# Patient Record
Sex: Female | Born: 1995
Health system: Southern US, Community
[De-identification: ages and names within clinical notes are randomized; demographics above are authoritative.]

## PROBLEM LIST (undated history)

## (undated) HISTORY — PX: WISDOM TOOTH EXTRACTION: SHX21

---
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
## 2014-01-11 IMAGING — CR RIGHT ELBOW - COMPLETE 3+ VIEW
1 series · 4 of 4 positions shown · non-contrast
Comparison: none

REASON FOR EXAM: injured elbow
COMMENTS:

PROCEDURE:     MDR - MDR ELBOW RT COMP W/ OBLIQUES  - May 12, 2012 [DATE]
RESULT:     Comparison: None.

[Series 1: lat · 0.17mm/px · 4 of 4 slices shown]
[im 1/4]
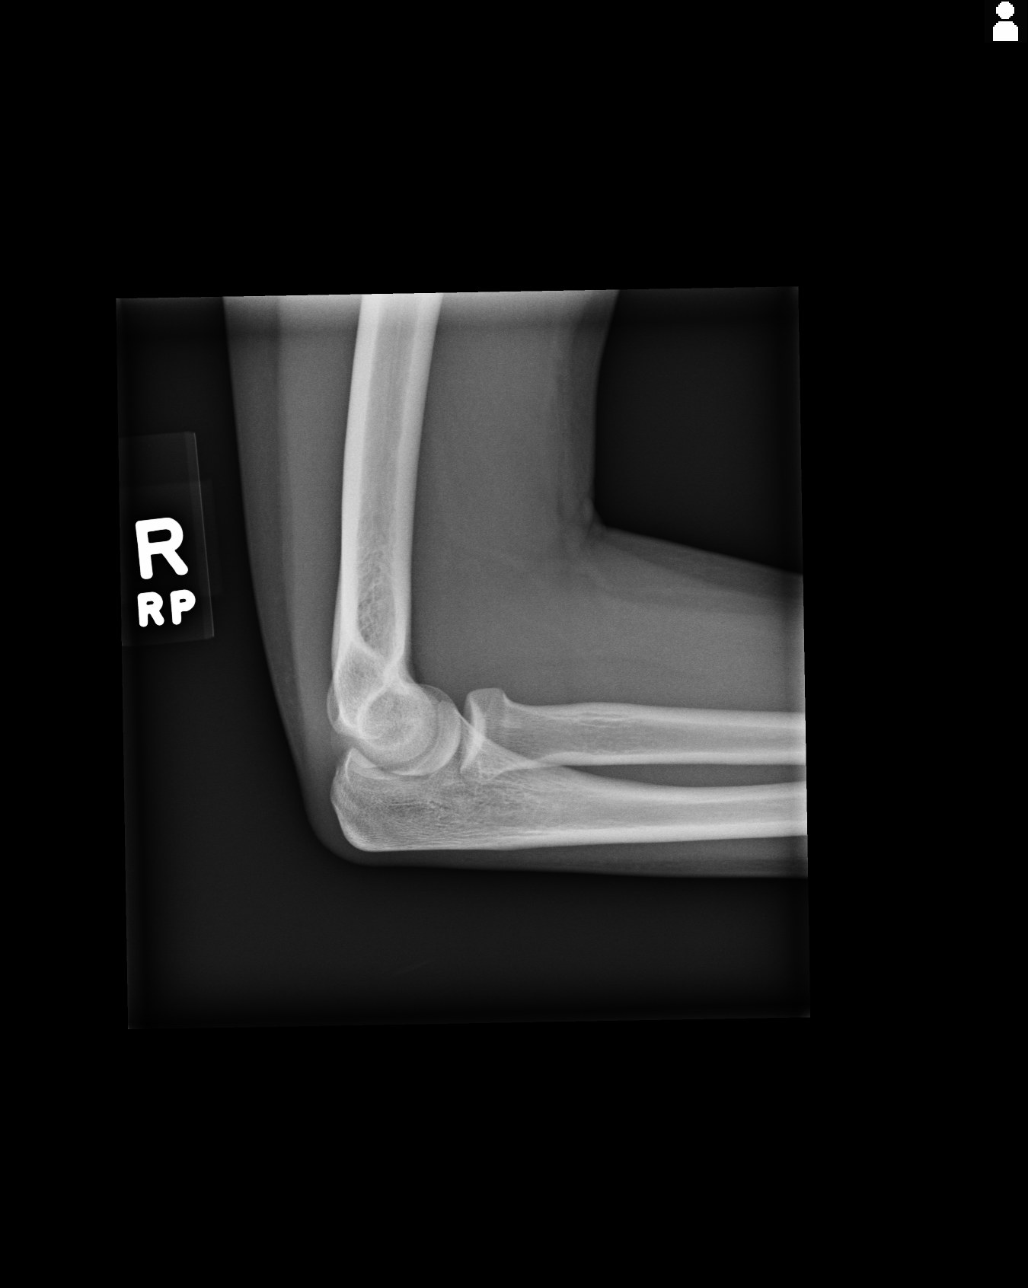
[im 2/4]
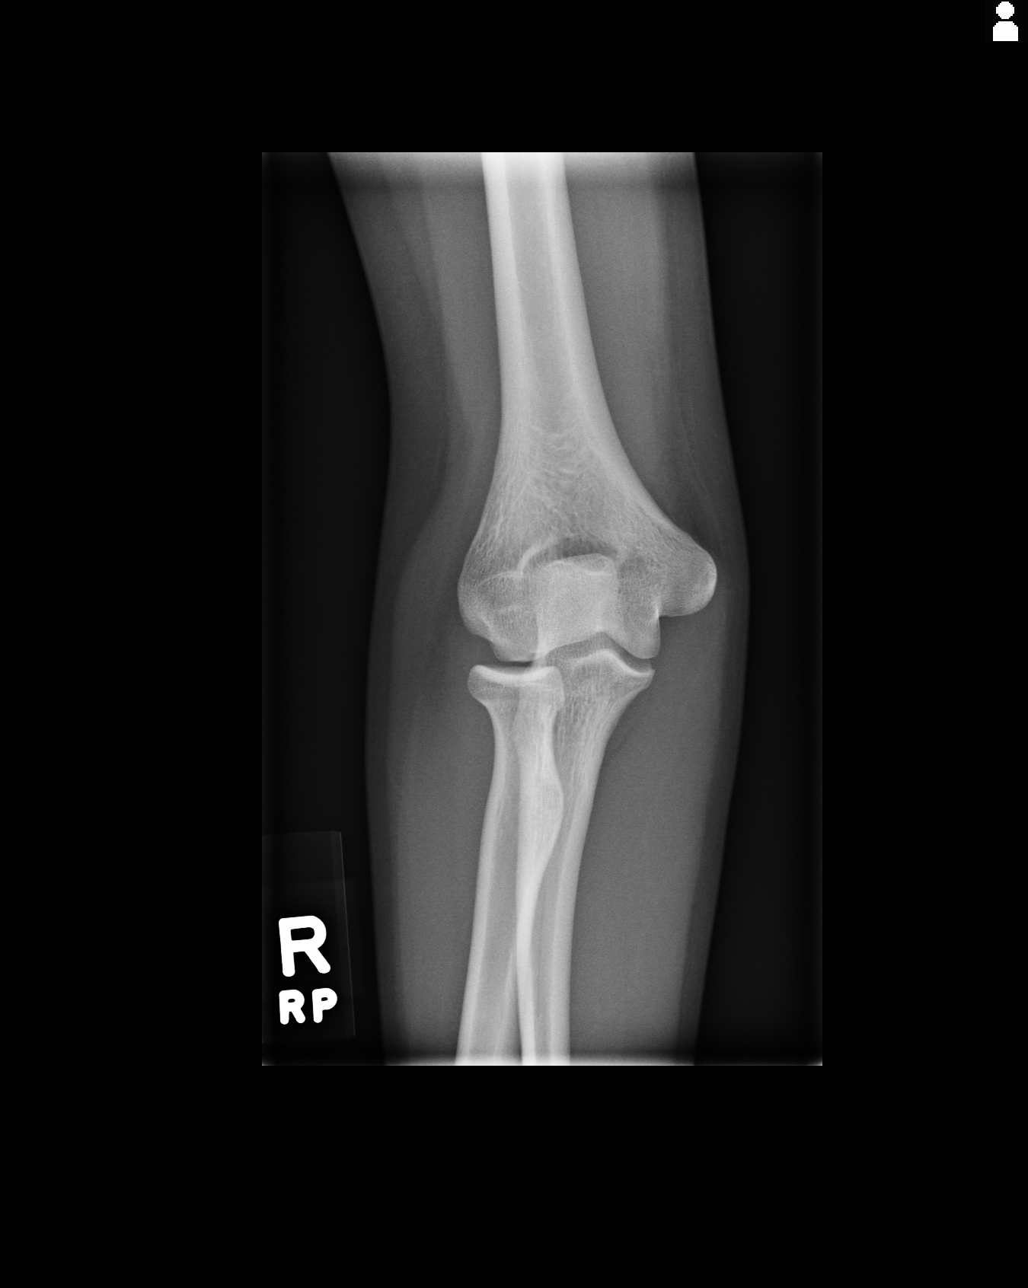
[im 3/4]
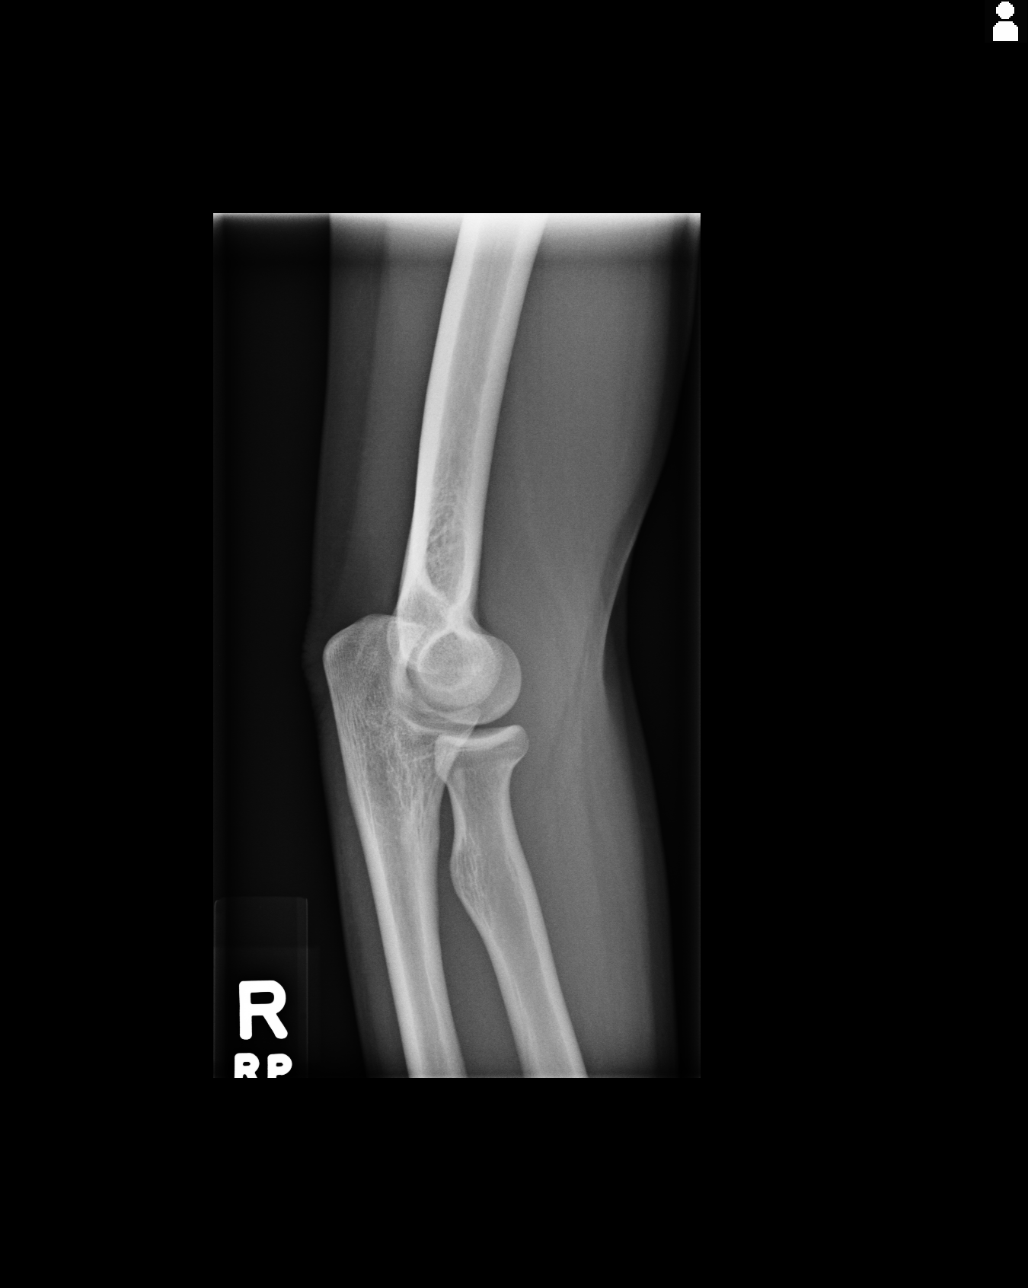
[im 4/4]
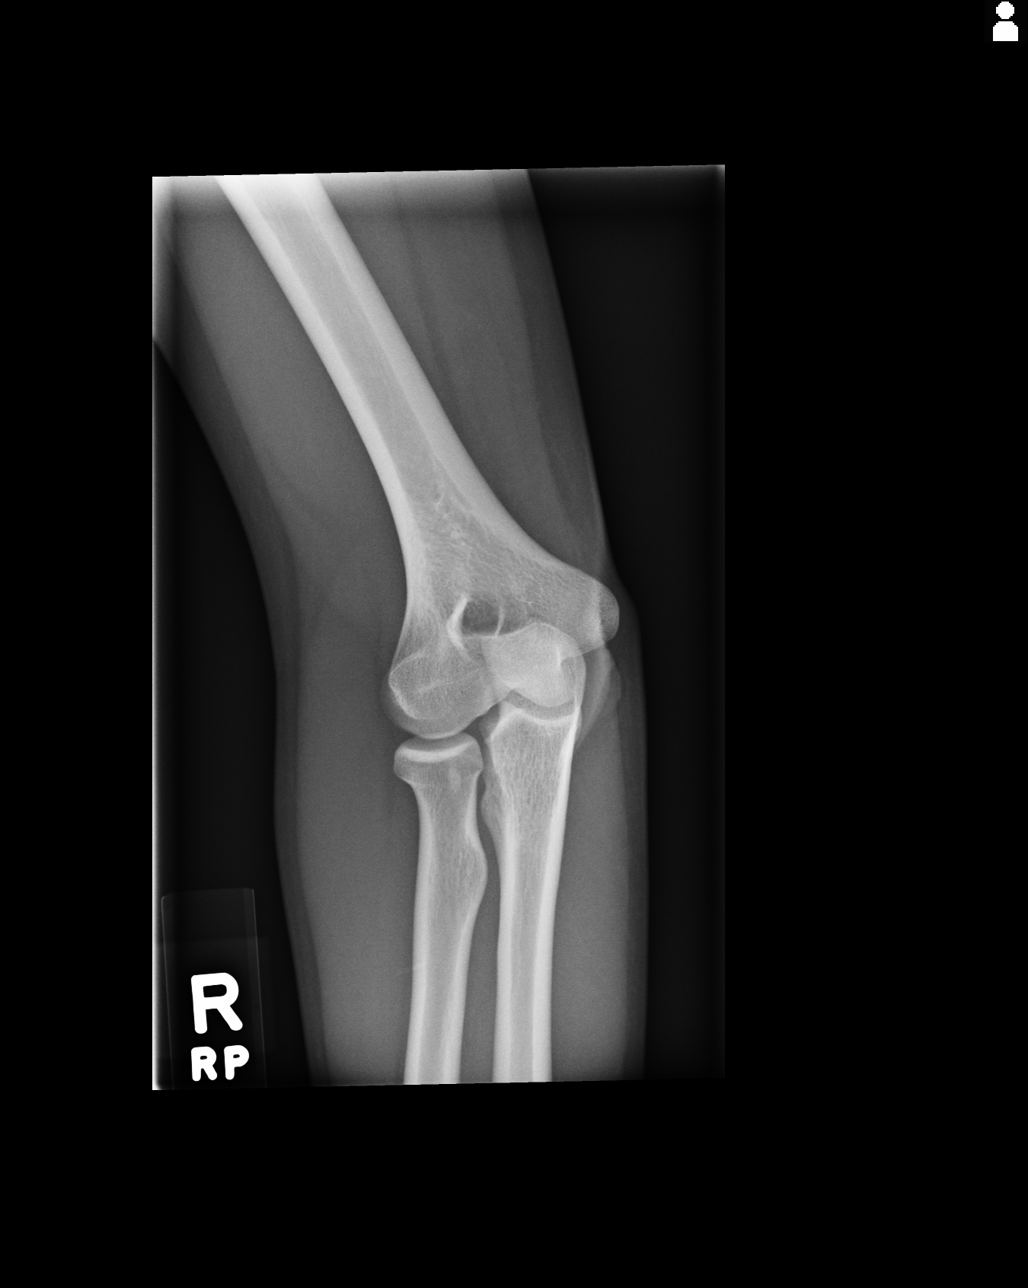

[4 of 4 positions shown; findings below may reference images not displayed]

FINDINGS: There is normal alignment. No elbow effusion. No acute fracture seen.
IMPRESSION: No fracture seen. If there is continued clinical concern, followup
radiographs could be performed in 7-10 days.

[REDACTED]

## 2014-12-24 ENCOUNTER — Encounter: Payer: Self-pay | Admitting: Unknown Physician Specialty

## 2014-12-26 ENCOUNTER — Ambulatory Visit (INDEPENDENT_AMBULATORY_CARE_PROVIDER_SITE_OTHER): Payer: 59 | Admitting: Unknown Physician Specialty

## 2014-12-26 ENCOUNTER — Encounter: Payer: Self-pay | Admitting: Unknown Physician Specialty

## 2014-12-26 VITALS — BP 106/75 | HR 64 | Temp 98.2°F | Ht 65.0 in | Wt 134.4 lb

## 2014-12-26 DIAGNOSIS — Z0000 Encounter for general adult medical examination without abnormal findings: Secondary | ICD-10-CM

## 2014-12-26 LAB — CBC
HEMATOCRIT: 39.9 % (ref 34.0–46.6)
Hemoglobin: 13.9 g/dL (ref 11.1–15.9)
MCH: 32.9 pg (ref 26.6–33.0)
MCHC: 34.8 g/dL (ref 31.5–35.7)
MCV: 94 fL (ref 79–97)
Platelets: 264 10*3/uL (ref 150–379)
RBC: 4.23 x10E6/uL (ref 3.77–5.28)
RDW: 12.7 % (ref 12.3–15.4)
WBC: 5.9 10*3/uL (ref 3.4–10.8)

## 2014-12-26 NOTE — Progress Notes (Signed)
   BP 106/75 mmHg  Pulse 64  Temp(Src) 98.2 F (36.8 C)  Ht 5\' 5"  (1.651 m)  Wt 134 lb 6.4 oz (60.963 kg)  BMI 22.37 kg/m2  SpO2 98%  LMP 12/12/2014 (Exact Date)   Subjective:    Patient ID: Zoe Ramsey, female    DOB: 1995-09-30, 19 y.o.   MRN: [id number]  HPI: Zoe Ramsey is a 19 y.o. female  Chief Complaint  Patient presents with  . Establish Care    Relevant past medical, surgical, family and social history reviewed and updated as indicated. Interim medical history since our last visit reviewed. Allergies and medications reviewed and updated.  Review of Systems  Constitutional: Negative.   HENT: Negative.   Eyes: Negative.   Respiratory: Negative.   Cardiovascular: Negative.   Gastrointestinal: Negative.   Endocrine: Negative.   Genitourinary: Negative.   Musculoskeletal: Negative.   Skin: Negative.   Allergic/Immunologic: Negative.   Neurological: Negative.   Hematological: Negative.   Psychiatric/Behavioral: Negative.     Per HPI unless specifically indicated above     Objective:    BP 106/75 mmHg  Pulse 64  Temp(Src) 98.2 F (36.8 C)  Ht 5\' 5"  (1.651 m)  Wt 134 lb 6.4 oz (60.963 kg)  BMI 22.37 kg/m2  SpO2 98%  LMP 12/12/2014 (Exact Date)  Wt Readings from Last 3 Encounters:  12/26/14 134 lb 6.4 oz (60.963 kg)    Physical Exam  Constitutional: She is oriented to person, place, and time. She appears well-developed and well-nourished.  HENT:  Head: Normocephalic and atraumatic.  Eyes: Pupils are equal, round, and reactive to light. Left eye exhibits no discharge.  Neck: Normal range of motion. Neck supple. Carotid bruit is not present. No thyromegaly present.  Cardiovascular: Normal rate, regular rhythm and normal heart sounds.  Exam reveals no gallop and no friction rub.   No murmur heard. Pulmonary/Chest: Effort normal and breath sounds normal. No respiratory distress. She has no wheezes. She has no rales.  Abdominal: Soft. Bowel  sounds are normal. There is no tenderness. There is no rebound.  Musculoskeletal: Normal range of motion.  Lymphadenopathy:    She has no cervical adenopathy.  Neurological: She is alert and oriented to person, place, and time.  Skin: Skin is warm, dry and intact. No rash noted.  Psychiatric: She has a normal mood and affect. Her speech is normal and behavior is normal. Judgment and thought content normal. Cognition and memory are normal.    No results found for this or any previous visit.    Assessment & Plan:   Problem List Items Addressed This Visit    None    Visit Diagnoses    Annual physical exam    -  Primary    Relevant Orders    CBC    Lipid Panel w/o Chol/HDL Ratio        Follow up plan: Return if symptoms worsen or fail to improve.

## 2014-12-27 ENCOUNTER — Encounter: Payer: Self-pay | Admitting: Unknown Physician Specialty

## 2014-12-27 LAB — LIPID PANEL W/O CHOL/HDL RATIO
Cholesterol, Total: 168 mg/dL (ref 100–199)
HDL: 53 mg/dL (ref >39)
LDL Calculated: 98 mg/dL (ref 0–99)
Triglycerides: 83 mg/dL (ref 0–149)
VLDL Cholesterol Cal: 17 mg/dL (ref 5–40)

## 2015-12-30 ENCOUNTER — Ambulatory Visit (INDEPENDENT_AMBULATORY_CARE_PROVIDER_SITE_OTHER): Payer: 59 | Admitting: Unknown Physician Specialty

## 2015-12-30 ENCOUNTER — Encounter: Payer: Self-pay | Admitting: Unknown Physician Specialty

## 2015-12-30 VITALS — BP 100/67 | HR 65 | Temp 97.6°F | Ht 64.6 in | Wt 136.0 lb

## 2015-12-30 DIAGNOSIS — Z0000 Encounter for general adult medical examination without abnormal findings: Secondary | ICD-10-CM | POA: Diagnosis not present

## 2015-12-30 DIAGNOSIS — Z23 Encounter for immunization: Secondary | ICD-10-CM | POA: Diagnosis not present

## 2015-12-30 NOTE — Progress Notes (Signed)
BP 100/67 (BP Location: Left Arm, Patient Position: Sitting, Cuff Size: Normal)   Pulse 65   Temp 97.6 F (36.4 C)   Ht 5' 4.6" (1.641 m)   Wt 136 lb (61.7 kg)   LMP 12/29/2015 (Exact Date)   SpO2 97%   BMI 22.91 kg/m    Subjective:    Patient ID: Zoe Ramsey, female    DOB: 1996-04-08, 20 y.o.   MRN: [id number]  HPI: Zoe Ramsey is a 20 y.o. female  Chief Complaint  Patient presents with  . Annual Exam    pt is due for pap but is currently on menstrual cycle   Pt is on her period.  She is not sexually active nor has ever been and not comfortable with a pelvic at this time.    States her menses are monthly and last about 5 days.    She feels she had had her Ghardisil vaccine as she remembers the 3 shot series.    Relevant past medical, surgical, family and social history reviewed and updated as indicated. Interim medical history since our last visit reviewed. Allergies and medications reviewed and updated.  Review of Systems  Per HPI unless specifically indicated above     Objective:    BP 100/67 (BP Location: Left Arm, Patient Position: Sitting, Cuff Size: Normal)   Pulse 65   Temp 97.6 F (36.4 C)   Ht 5' 4.6" (1.641 m)   Wt 136 lb (61.7 kg)   LMP 12/29/2015 (Exact Date)   SpO2 97%   BMI 22.91 kg/m   Wt Readings from Last 3 Encounters:  12/30/15 136 lb (61.7 kg)  12/26/14 134 lb 6.4 oz (61 kg)    Physical Exam  Constitutional: She is oriented to person, place, and time. She appears well-developed and well-nourished.  HENT:  Head: Normocephalic and atraumatic.  Eyes: Pupils are equal, round, and reactive to light. Right eye exhibits no discharge. Left eye exhibits no discharge. No scleral icterus.  Neck: Normal range of motion. Neck supple. Carotid bruit is not present. No thyromegaly present.  Cardiovascular: Normal rate, regular rhythm and normal heart sounds.  Exam reveals no gallop and no friction rub.   No murmur  heard. Pulmonary/Chest: Effort normal and breath sounds normal. No respiratory distress. She has no wheezes. She has no rales.  Abdominal: Soft. Bowel sounds are normal. There is no tenderness. There is no rebound.  Genitourinary: No breast swelling, tenderness or discharge.  Musculoskeletal: Normal range of motion.  Lymphadenopathy:    She has no cervical adenopathy.  Neurological: She is alert and oriented to person, place, and time.  Skin: Skin is warm, dry and intact. No rash noted.  Psychiatric: She has a normal mood and affect. Her speech is normal and behavior is normal. Judgment and thought content normal. Cognition and memory are normal.    Results for orders placed or performed in visit on 12/26/14  CBC  Result Value Ref Range   WBC 5.9 3.4 - 10.8 x10E3/uL   RBC 4.23 3.77 - 5.28 x10E6/uL   Hemoglobin 13.9 11.1 - 15.9 g/dL   Hematocrit 40.9 81.1 - 46.6 %   MCV 94 79 - 97 fL   MCH 32.9 26.6 - 33.0 pg   MCHC 34.8 31.5 - 35.7 g/dL   RDW 91.4 78.2 - 95.6 %   Platelets 264 150 - 379 x10E3/uL  Lipid Panel w/o Chol/HDL Ratio  Result Value Ref Range   Cholesterol, Total 168 100 -  199 mg/dL   Triglycerides 83 0 - 149 mg/dL   HDL 53 >65 mg/dL   VLDL Cholesterol Cal 17 5 - 40 mg/dL   LDL Calculated 98 0 - 99 mg/dL      Assessment & Plan:   Problem List Items Addressed This Visit    None    Visit Diagnoses    Annual physical exam    -  Primary       Follow up plan: Return in about 1 year (around 12/29/2016).

## 2016-12-30 ENCOUNTER — Encounter: Payer: 59 | Admitting: Unknown Physician Specialty

## 2017-01-06 ENCOUNTER — Encounter: Payer: Self-pay | Admitting: Unknown Physician Specialty

## 2017-01-06 ENCOUNTER — Ambulatory Visit (INDEPENDENT_AMBULATORY_CARE_PROVIDER_SITE_OTHER): Payer: 59 | Admitting: Unknown Physician Specialty

## 2017-01-06 VITALS — BP 119/82 | HR 75 | Temp 97.9°F | Ht 64.7 in | Wt 130.2 lb

## 2017-01-06 DIAGNOSIS — Z0000 Encounter for general adult medical examination without abnormal findings: Secondary | ICD-10-CM

## 2017-01-06 NOTE — Patient Instructions (Addendum)
Health Maintenance, Female Adopting a healthy lifestyle and getting preventive care can go a long way to promote health and wellness. Talk with your health care provider about what schedule of regular examinations is right for you. This is a good chance for you to check in with your provider about disease prevention and staying healthy. In between checkups, there are plenty of things you can do on your own. Experts have done a lot of research about which lifestyle changes and preventive measures are most likely to keep you healthy. Ask your health care provider for more information. Weight and diet Eat a healthy diet  Be sure to include plenty of vegetables, fruits, low-fat dairy products, and lean protein.  Do not eat a lot of foods high in solid fats, added sugars, or salt.  Get regular exercise. This is one of the most important things you can do for your health. ? Most adults should exercise for at least 150 minutes each week. The exercise should increase your heart rate and make you sweat (moderate-intensity exercise). ? Most adults should also do strengthening exercises at least twice a week. This is in addition to the moderate-intensity exercise.  Maintain a healthy weight  Body mass index (BMI) is a measurement that can be used to identify possible weight problems. It estimates body fat based on height and weight. Your health care provider can help determine your BMI and help you achieve or maintain a healthy weight.  For females 20 years of age and older: ? A BMI below 18.5 is considered underweight. ? A BMI of 18.5 to 24.9 is normal. ? A BMI of 25 to 29.9 is considered overweight. ? A BMI of 30 and above is considered obese.  Watch levels of cholesterol and blood lipids  You should start having your blood tested for lipids and cholesterol at 22 years of age, then have this test every 5 years.  You may need to have your cholesterol levels checked more often if: ? Your lipid or  cholesterol levels are high. ? You are older than 22 years of age. ? You are at high risk for heart disease.  Cancer screening Lung Cancer  Lung cancer screening is recommended for adults 55-80 years old who are at high risk for lung cancer because of a history of smoking.  A yearly low-dose CT scan of the lungs is recommended for people who: ? Currently smoke. ? Have quit within the past 15 years. ? Have at least a 30-pack-year history of smoking. A pack year is smoking an average of one pack of cigarettes a day for 1 year.  Yearly screening should continue until it has been 15 years since you quit.  Yearly screening should stop if you develop a health problem that would prevent you from having lung cancer treatment.  Breast Cancer  Practice breast self-awareness. This means understanding how your breasts normally appear and feel.  It also means doing regular breast self-exams. Let your health care provider know about any changes, no matter how small.  If you are in your 20s or 30s, you should have a clinical breast exam (CBE) by a health care provider every 1-3 years as part of a regular health exam.  If you are 40 or older, have a CBE every year. Also consider having a breast X-ray (mammogram) every year.  If you have a family history of breast cancer, talk to your health care provider about genetic screening.  If you are at high risk   for breast cancer, talk to your health care provider about having an MRI and a mammogram every year.  Breast cancer gene (BRCA) assessment is recommended for women who have family members with BRCA-related cancers. BRCA-related cancers include: ? Breast. ? Ovarian. ? Tubal. ? Peritoneal cancers.  Results of the assessment will determine the need for genetic counseling and BRCA1 and BRCA2 testing.  Cervical Cancer Your health care provider may recommend that you be screened regularly for cancer of the pelvic organs (ovaries, uterus, and  vagina). This screening involves a pelvic examination, including checking for microscopic changes to the surface of your cervix (Pap test). You may be encouraged to have this screening done every 3 years, beginning at age 22.  For women ages 56-65, health care providers may recommend pelvic exams and Pap testing every 3 years, or they may recommend the Pap and pelvic exam, combined with testing for human papilloma virus (HPV), every 5 years. Some types of HPV increase your risk of cervical cancer. Testing for HPV may also be done on women of any age with unclear Pap test results.  Other health care providers may not recommend any screening for nonpregnant women who are considered low risk for pelvic cancer and who do not have symptoms. Ask your health care provider if a screening pelvic exam is right for you.  If you have had past treatment for cervical cancer or a condition that could lead to cancer, you need Pap tests and screening for cancer for at least 20 years after your treatment. If Pap tests have been discontinued, your risk factors (such as having a new sexual partner) need to be reassessed to determine if screening should resume. Some women have medical problems that increase the chance of getting cervical cancer. In these cases, your health care provider may recommend more frequent screening and Pap tests.  Colorectal Cancer  This type of cancer can be detected and often prevented.  Routine colorectal cancer screening usually begins at 22 years of age and continues through 22 years of age.  Your health care provider may recommend screening at an earlier age if you have risk factors for colon cancer.  Your health care provider may also recommend using home test kits to check for hidden blood in the stool.  A small camera at the end of a tube can be used to examine your colon directly (sigmoidoscopy or colonoscopy). This is done to check for the earliest forms of colorectal  cancer.  Routine screening usually begins at age 33.  Direct examination of the colon should be repeated every 5-10 years through 22 years of age. However, you may need to be screened more often if early forms of precancerous polyps or small growths are found.  Skin Cancer  Check your skin from head to toe regularly.  Tell your health care provider about any new moles or changes in moles, especially if there is a change in a mole's shape or color.  Also tell your health care provider if you have a mole that is larger than the size of a pencil eraser.  Always use sunscreen. Apply sunscreen liberally and repeatedly throughout the day.  Protect yourself by wearing long sleeves, pants, a wide-brimmed hat, and sunglasses whenever you are outside.  Heart disease, diabetes, and high blood pressure  High blood pressure causes heart disease and increases the risk of stroke. High blood pressure is more likely to develop in: ? People who have blood pressure in the high end of  the normal range (130-139/85-89 mm Hg). ? People who are overweight or obese. ? People who are African American.  If you are 21-29 years of age, have your blood pressure checked every 3-5 years. If you are 3 years of age or older, have your blood pressure checked every year. You should have your blood pressure measured twice-once when you are at a hospital or clinic, and once when you are not at a hospital or clinic. Record the average of the two measurements. To check your blood pressure when you are not at a hospital or clinic, you can use: ? An automated blood pressure machine at a pharmacy. ? A home blood pressure monitor.  If you are between 17 years and 37 years old, ask your health care provider if you should take aspirin to prevent strokes.  Have regular diabetes screenings. This involves taking a blood sample to check your fasting blood sugar level. ? If you are at a normal weight and have a low risk for diabetes,  have this test once every three years after 22 years of age. ? If you are overweight and have a high risk for diabetes, consider being tested at a younger age or more often. Preventing infection Hepatitis B  If you have a higher risk for hepatitis B, you should be screened for this virus. You are considered at high risk for hepatitis B if: ? You were born in a country where hepatitis B is common. Ask your health care provider which countries are considered high risk. ? Your parents were born in a high-risk country, and you have not been immunized against hepatitis B (hepatitis B vaccine). ? You have HIV or AIDS. ? You use needles to inject street drugs. ? You live with someone who has hepatitis B. ? You have had sex with someone who has hepatitis B. ? You get hemodialysis treatment. ? You take certain medicines for conditions, including cancer, organ transplantation, and autoimmune conditions.  Hepatitis C  Blood testing is recommended for: ? Everyone born from [birth year] through 1966. ? Anyone with known risk factors for hepatitis C.  Sexually transmitted infections (STIs)  You should be screened for sexually transmitted infections (STIs) including gonorrhea and chlamydia if: ? You are sexually active and are younger than 22 years of age. ? You are older than 22 years of age and your health care provider tells you that you are at risk for this type of infection. ? Your sexual activity has changed since you were last screened and you are at an increased risk for chlamydia or gonorrhea. Ask your health care provider if you are at risk.  If you do not have HIV, but are at risk, it may be recommended that you take a prescription medicine daily to prevent HIV infection. This is called pre-exposure prophylaxis (PrEP). You are considered at risk if: ? You are sexually active and do not regularly use condoms or know the HIV status of your partner(s). ? You take drugs by injection. ? You are  sexually active with a partner who has HIV.  Talk with your health care provider about whether you are at high risk of being infected with HIV. If you choose to begin PrEP, you should first be tested for HIV. You should then be tested every 3 months for as long as you are taking PrEP. Pregnancy  If you are premenopausal and you may become pregnant, ask your health care provider about preconception counseling.  If you may become  pregnant, take 400 to 800 micrograms (mcg) of folic acid every day.  If you want to prevent pregnancy, talk to your health care provider about birth control (contraception). Osteoporosis and menopause  Osteoporosis is a disease in which the bones lose minerals and strength with aging. This can result in serious bone fractures. Your risk for osteoporosis can be identified using a bone density scan.  If you are 65 years of age or older, or if you are at risk for osteoporosis and fractures, ask your health care provider if you should be screened.  Ask your health care provider whether you should take a calcium or vitamin D supplement to lower your risk for osteoporosis.  Menopause may have certain physical symptoms and risks.  Hormone replacement therapy may reduce some of these symptoms and risks. Talk to your health care provider about whether hormone replacement therapy is right for you. Follow these instructions at home:  Schedule regular health, dental, and eye exams.  Stay current with your immunizations.  Do not use any tobacco products including cigarettes, chewing tobacco, or electronic cigarettes.  If you are pregnant, do not drink alcohol.  If you are breastfeeding, limit how much and how often you drink alcohol.  Limit alcohol intake to no more than 1 drink per day for nonpregnant women. One drink equals 12 ounces of beer, 5 ounces of wine, or 1 ounces of hard liquor.  Do not use street drugs.  Do not share needles.  Ask your health care  provider for help if you need support or information about quitting drugs.  Tell your health care provider if you often feel depressed.  Tell your health care provider if you have ever been abused or do not feel safe at home. This information is not intended to replace advice given to you by your health care provider. Make sure you discuss any questions you have with your health care provider. Document Released: 12/08/2010 Document Revised: 10/31/2015 Document Reviewed: 02/26/2015 Elsevier Interactive Patient Education  2018 [employer]  Preventive Care 18-39 Years, Female Preventive care refers to lifestyle choices and visits with your health care provider that can promote health and wellness. What does preventive care include?  A yearly physical exam. This is also called an annual well check.  Dental exams once or twice a year.  Routine eye exams. Ask your health care provider how often you should have your eyes checked.  Personal lifestyle choices, including: ? Daily care of your teeth and gums. ? Regular physical activity. ? Eating a healthy diet. ? Avoiding tobacco and drug use. ? Limiting alcohol use. ? Practicing safe sex. ? Taking vitamin and mineral supplements as recommended by your health care provider. What happens during an annual well check? The services and screenings done by your health care provider during your annual well check will depend on your age, overall health, lifestyle risk factors, and family history of disease. Counseling Your health care provider may ask you questions about your:  Alcohol use.  Tobacco use.  Drug use.  Emotional well-being.  Home and relationship well-being.  Sexual activity.  Eating habits.  Work and work environment.  Method of birth control.  Menstrual cycle.  Pregnancy history.  Screening You may have the following tests or measurements:  Height, weight, and BMI.  Diabetes screening. This is done by  checking your blood sugar (glucose) after you have not eaten for a while (fasting).  Blood pressure.  Lipid and cholesterol levels. These may be checked   every 5 years starting at age 20.  Skin check.  Hepatitis C blood test.  Hepatitis B blood test.  Sexually transmitted disease (STD) testing.  BRCA-related cancer screening. This may be done if you have a family history of breast, ovarian, tubal, or peritoneal cancers.  Pelvic exam and Pap test. This may be done every 3 years starting at age 21. Starting at age 30, this may be done every 5 years if you have a Pap test in combination with an HPV test.  Discuss your test results, treatment options, and if necessary, the need for more tests with your health care provider. Vaccines Your health care provider may recommend certain vaccines, such as:  Influenza vaccine. This is recommended every year.  Tetanus, diphtheria, and acellular pertussis (Tdap, Td) vaccine. You may need a Td booster every 10 years.  Varicella vaccine. You may need this if you have not been vaccinated.  HPV vaccine. If you are 26 or younger, you may need three doses over 6 months.  Measles, mumps, and rubella (MMR) vaccine. You may need at least one dose of MMR. You may also need a second dose.  Pneumococcal 13-valent conjugate (PCV13) vaccine. You may need this if you have certain conditions and were not previously vaccinated.  Pneumococcal polysaccharide (PPSV23) vaccine. You may need one or two doses if you smoke cigarettes or if you have certain conditions.  Meningococcal vaccine. One dose is recommended if you are age 19-21 years and a first-year college student living in a residence hall, or if you have one of several medical conditions. You may also need additional booster doses.  Hepatitis A vaccine. You may need this if you have certain conditions or if you travel or work in places where you may be exposed to hepatitis A.  Hepatitis B vaccine. You  may need this if you have certain conditions or if you travel or work in places where you may be exposed to hepatitis B.  Haemophilus influenzae type b (Hib) vaccine. You may need this if you have certain risk factors.  Talk to your health care provider about which screenings and vaccines you need and how often you need them. This information is not intended to replace advice given to you by your health care provider. Make sure you discuss any questions you have with your health care provider. Document Released: 07/21/2001 Document Revised: 02/12/2016 Document Reviewed: 03/26/2015 Elsevier Interactive Patient Education  2017 [employer] Inc.  

## 2017-01-06 NOTE — Progress Notes (Signed)
BP 119/82   Pulse 75   Temp 97.9 F (36.6 C)   Ht 5' 4.7" (1.643 m)   Wt 130 lb 3.2 oz (59.1 kg)   LMP 12/24/2016 (Exact Date)   SpO2 98%   BMI 21.87 kg/m    Subjective:    Patient ID: Zoe Ramsey, female    DOB: 1996/02/23, 21 y.o.   MRN: [id number]  HPI: Zoe Ramsey is a 21 y.o. female  Chief Complaint  Patient presents with  . 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    Not interested in Birth control at this time.  [id number]  Fever blisters on lips and interested in a Valtrex rx to take as needed.    Relevant past medical, surgical, family and social history reviewed and updated as indicated. Interim medical history since our last visit reviewed. Allergies and medications reviewed and updated.  Review of Systems  Constitutional: Negative.   HENT: Negative.   Eyes: Negative.   Respiratory: Negative.   Cardiovascular: Negative.   Gastrointestinal: Negative.   Endocrine: Negative.   Genitourinary: Negative.   Musculoskeletal: Negative.   Skin: Negative.   Allergic/Immunologic: Negative.   Neurological: Negative.   Hematological: Negative.   Psychiatric/Behavioral: Negative.     Per HPI unless specifically indicated above     Objective:    BP 119/82   Pulse 75   Temp 97.9 F (36.6 C)   Ht 5' 4.7" (1.643 m)   Wt 130 lb 3.2 oz (59.1 kg)   LMP 12/24/2016 (Exact Date)   SpO2 98%   BMI 21.87 kg/m   Wt Readings from Last 3 Encounters:  01/06/17 130 lb 3.2 oz (59.1 kg)  12/30/15 136 lb (61.7 kg)  12/26/14 134 lb 6.4 oz (61 kg)    Physical Exam  Constitutional: She is oriented to person, place, and time. She appears well-developed and well-nourished. No distress.  HENT:  Head: Normocephalic and atraumatic.  Eyes: Pupils are equal, round, and reactive to light. Conjunctivae and lids are normal. Right eye exhibits no discharge.  Left eye exhibits no discharge. No scleral icterus.  Neck: Normal range of motion. Neck supple. No JVD present. Carotid bruit is not present. No thyromegaly present.  Cardiovascular: Normal rate, regular rhythm and normal heart sounds.  Exam reveals no gallop and no friction rub.   No murmur heard. Pulmonary/Chest: Effort normal and breath sounds normal. No respiratory distress. She has no wheezes. She has no rales.  Abdominal: Soft. Normal appearance and bowel sounds are normal. There is no splenomegaly or hepatomegaly. There is no tenderness. There is no rebound.  Genitourinary: Vagina normal and uterus normal. No breast swelling, tenderness or discharge. Cervix exhibits no motion tenderness, no discharge and no friability. Right adnexum displays no mass, no tenderness and no fullness. Left adnexum displays no mass, no tenderness and no fullness.  Musculoskeletal: Normal range of motion.  Lymphadenopathy:    She has no cervical adenopathy.  Neurological: She is alert and oriented to person, place, and time.  Skin: Skin is warm, dry and intact. No rash noted. No pallor.  Psychiatric: She has a normal mood and affect. Her speech is normal and behavior is normal. Judgment and thought content normal. Cognition and memory are normal.    Results for orders placed or performed in visit on 12/26/14  CBC  Result  Value Ref Range   WBC 5.9 3.4 - 10.8 x10E3/uL   RBC 4.23 3.77 - 5.28 x10E6/uL   Hemoglobin 13.9 11.1 - 15.9 g/dL   Hematocrit 16.139.9 09.634.0 - 46.6 %   MCV 94 79 - 97 fL   MCH 32.9 26.6 - 33.0 pg   MCHC 34.8 31.5 - 35.7 g/dL   RDW 04.512.7 40.912.3 - 81.115.4 %   Platelets 264 150 - 379 x10E3/uL  Lipid Panel w/o Chol/HDL Ratio  Result Value Ref Range   Cholesterol, Total 168 100 - 199 mg/dL   Triglycerides 83 0 - 149 mg/dL   HDL 53 >91>39 mg/dL   VLDL Cholesterol Cal 17 5 - 40 mg/dL   LDL Calculated 98 0 - 99 mg/dL      Assessment & Plan:   Problem List Items Addressed This Visit    None      Visit Diagnoses    Annual physical exam    -  Primary   Relevant Orders   Pap Lb, rfx HPV ASCU   CBC with Differential/Platelet   Quantiferon tb gold assay (blood)       Follow up plan: Return in about 1 year (around 01/06/2018).

## 2017-01-07 LAB — CBC WITH DIFFERENTIAL/PLATELET
BASOS ABS: 0 10*3/uL (ref 0.0–0.2)
Basos: 0 %
EOS (ABSOLUTE): 0.1 10*3/uL (ref 0.0–0.4)
Eos: 2 %
Hematocrit: 39.3 % (ref 34.0–46.6)
Hemoglobin: 12.9 g/dL (ref 11.1–15.9)
IMMATURE GRANULOCYTES: 0 %
Immature Grans (Abs): 0 10*3/uL (ref 0.0–0.1)
Lymphocytes Absolute: 1.5 10*3/uL (ref 0.7–3.1)
Lymphs: 26 %
MCH: 30.7 pg (ref 26.6–33.0)
MCHC: 32.8 g/dL (ref 31.5–35.7)
MCV: 94 fL (ref 79–97)
Monocytes Absolute: 0.6 10*3/uL (ref 0.1–0.9)
Monocytes: 10 %
Neutrophils Absolute: 3.7 10*3/uL (ref 1.4–7.0)
Neutrophils: 62 %
Platelets: 313 10*3/uL (ref 150–379)
RBC: 4.2 x10E6/uL (ref 3.77–5.28)
RDW: 13.4 % (ref 12.3–15.4)
WBC: 5.9 10*3/uL (ref 3.4–10.8)

## 2017-01-08 LAB — PAP LB, RFX HPV ASCU: PAP Smear Comment: 0

## 2017-01-10 LAB — QUANTIFERON IN TUBE
QFT TB AG MINUS NIL VALUE: 0 IU/mL
QUANTIFERON MITOGEN VALUE: >10 IU/mL
QUANTIFERON TB AG VALUE: 0.09 IU/mL
QUANTIFERON TB GOLD: NEGATIVE
Quantiferon Nil Value: 0.09 IU/mL

## 2017-01-10 LAB — QUANTIFERON TB GOLD ASSAY (BLOOD)

## 2017-12-21 ENCOUNTER — Encounter: Payer: Self-pay | Admitting: Unknown Physician Specialty

## 2018-01-12 ENCOUNTER — Encounter: Payer: 59 | Admitting: Unknown Physician Specialty

## 2018-01-12 ENCOUNTER — Encounter: Payer: 59 | Admitting: Physician Assistant

## 2018-01-19 ENCOUNTER — Encounter: Payer: 59 | Admitting: Physician Assistant

## 2018-02-10 ENCOUNTER — Ambulatory Visit (INDEPENDENT_AMBULATORY_CARE_PROVIDER_SITE_OTHER): Payer: 59 | Admitting: Physician Assistant

## 2018-02-10 ENCOUNTER — Encounter: Payer: Self-pay | Admitting: Physician Assistant

## 2018-02-10 VITALS — BP 115/77 | HR 64 | Temp 98.3°F | Ht 64.8 in | Wt 130.2 lb

## 2018-02-10 DIAGNOSIS — Z130 Encounter for screening for diseases of the blood and blood-forming organs and certain disorders involving the immune mechanism: Secondary | ICD-10-CM | POA: Diagnosis not present

## 2018-02-10 DIAGNOSIS — Z131 Encounter for screening for diabetes mellitus: Secondary | ICD-10-CM | POA: Diagnosis not present

## 2018-02-10 DIAGNOSIS — Z0000 Encounter for general adult medical examination without abnormal findings: Secondary | ICD-10-CM | POA: Diagnosis not present

## 2018-02-10 DIAGNOSIS — Z1329 Encounter for screening for other suspected endocrine disorder: Secondary | ICD-10-CM | POA: Diagnosis not present

## 2018-02-10 DIAGNOSIS — Z13220 Encounter for screening for lipoid disorders: Secondary | ICD-10-CM

## 2018-02-10 NOTE — Progress Notes (Signed)
Subjective:    Patient ID: Zoe Ramsey, female    DOB: 1996-03-12, 22 y.o.   MRN: [id number]  Zoe Ramsey is a 22 y.o. female presenting on 02/10/2018 for Annual Exam   HPI  Living in [place] with parents, works at [employer]. No children, in a relationship, sexually active. Does not desire birth control. Denies flu shot.   PAP: 2018 normal  Tetanus: 2017 updated.    History reviewed. No pertinent past medical history. Past Surgical History:  Procedure Laterality Date  . WISDOM TOOTH EXTRACTION     Social History   Socioeconomic History  . Marital status: Single    Spouse name: Not on file  . Number of children: Not on file  . Years of education: Not on file  . Highest education level: Not on file  Occupational History  . Not on file  Social Needs  . Financial resource strain: Not on file  . Food insecurity:    Worry: Not on file    Inability: Not on file  . Transportation needs:    Medical: Not on file    Non-medical: Not on file  Tobacco Use  . Smoking status: Never Smoker  . Smokeless tobacco: Never Used  Substance and Sexual Activity  . Alcohol use: No    Alcohol/week: 0.0 standard drinks  . Drug use: No  . Sexual activity: Never  Lifestyle  . Physical activity:    Days per week: Not on file    Minutes per session: Not on file  . Stress: Not on file  Relationships  . Social connections:    Talks on phone: Not on file    Gets together: Not on file    Attends religious service: Not on file    Active member of club or organization: Not on file    Attends meetings of clubs or organizations: Not on file    Relationship status: Not on file  . Intimate partner violence:    Fear of current or ex partner: Not on file    Emotionally abused: Not on file    Physically abused: Not on file    Forced sexual activity: Not on file  Other Topics Concern  . Not on file  Social History Narrative  . Not on file   Family History  Problem Relation  Age of Onset  . Hypertension Father   . Heart disease Maternal Grandmother        CHF  . Arthritis Maternal Grandmother    No current outpatient medications on file prior to visit.   No current facility-administered medications on file prior to visit.     Review of Systems Per HPI unless specifically indicated above     Objective:    BP 115/77   Pulse 64   Temp 98.3 F (36.8 C) (Oral)   Ht 5' 4.8" (1.646 m)   Wt 130 lb 3.2 oz (59.1 kg)   LMP 01/22/2018 (Approximate)   SpO2 99%   BMI 21.80 kg/m   Wt Readings from Last 3 Encounters:  02/10/18 130 lb 3.2 oz (59.1 kg)  01/06/17 130 lb 3.2 oz (59.1 kg)  12/30/15 136 lb (61.7 kg)    Physical Exam  Constitutional: She is oriented to person, place, and time. She appears well-developed and well-nourished.  Cardiovascular: Normal rate and regular rhythm.  Pulmonary/Chest: Effort normal and breath sounds normal.  Abdominal: Soft. Bowel sounds are normal.  Neurological: She is alert and oriented to person, place, and  time.  Skin: Skin is warm and dry.  Psychiatric: She has a normal mood and affect. Her behavior is normal.   Results for orders placed or performed in visit on 01/06/17  CBC with Differential/Platelet  Result Value Ref Range   WBC 5.9 3.4 - 10.8 x10E3/uL   RBC 4.20 3.77 - 5.28 x10E6/uL   Hemoglobin 12.9 11.1 - 15.9 g/dL   Hematocrit 39.3 34.0 - 46.6 %   MCV 94 79 - 97 fL   MCH 30.7 26.6 - 33.0 pg   MCHC 32.8 31.5 - 35.7 g/dL   RDW 13.4 12.3 - 15.4 %   Platelets 313 150 - 379 x10E3/uL   Neutrophils 62 Not Estab. %   Lymphs 26 Not Estab. %   Monocytes 10 Not Estab. %   Eos 2 Not Estab. %   Basos 0 Not Estab. %   Neutrophils Absolute 3.7 1.4 - 7.0 x10E3/uL   Lymphocytes Absolute 1.5 0.7 - 3.1 x10E3/uL   Monocytes Absolute 0.6 0.1 - 0.9 x10E3/uL   EOS (ABSOLUTE) 0.1 0.0 - 0.4 x10E3/uL   Basophils Absolute 0.0 0.0 - 0.2 x10E3/uL   Immature Granulocytes 0 Not Estab. %   Immature Grans (Abs) 0.0 0.0 - 0.1  x10E3/uL  Quantiferon tb gold assay (blood)  Result Value Ref Range   QUANTIFERON INCUBATION Comment   QuantiFERON In Tube  Result Value Ref Range   QUANTIFERON TB GOLD Negative Negative   QUANTIFERON CRITERIA Comment    QUANTIFERON TB AG VALUE 0.09 IU/mL   Quantiferon Nil Value 0.09 IU/mL   QUANTIFERON MITOGEN VALUE >10.00 IU/mL   QFT TB AG MINUS NIL VALUE 0.00 IU/mL   Interpretation: Comment   Pap Lb, rfx HPV ASCU  Result Value Ref Range   DIAGNOSIS: Comment    Specimen adequacy: Comment    Clinician Provided ICD10 Comment    Performed by: Comment    PAP Smear Comment .    Note: Comment    PAP Reflex Comment       Assessment & Plan:  1. Annual physical exam   2. Diabetes mellitus screening  - Comp Met (CMET)  3. Screening for deficiency anemia  - CBC with Differential  4. Thyroid disorder screening  - TSH  5. Lipid screening  - Lipid Profile    Follow up plan: Return in about 1 year (around 02/11/2019) for CPE.  Carles Collet, PA-C [facility] 02/10/2018, 10:40 AM

## 2018-02-10 NOTE — Patient Instructions (Signed)

## 2018-02-11 ENCOUNTER — Telehealth: Payer: Self-pay | Admitting: Unknown Physician Specialty

## 2018-02-11 LAB — CBC WITH DIFFERENTIAL/PLATELET
Basophils Absolute: 0 10*3/uL (ref 0.0–0.2)
Basos: 1 %
EOS (ABSOLUTE): 0.1 10*3/uL (ref 0.0–0.4)
Eos: 3 %
Hematocrit: 40.2 % (ref 34.0–46.6)
Hemoglobin: 13.1 g/dL (ref 11.1–15.9)
Immature Grans (Abs): 0 10*3/uL (ref 0.0–0.1)
Immature Granulocytes: 0 %
Lymphocytes Absolute: 2 10*3/uL (ref 0.7–3.1)
Lymphs: 38 %
MCH: 30.9 pg (ref 26.6–33.0)
MCHC: 32.6 g/dL (ref 31.5–35.7)
MCV: 95 fL (ref 79–97)
Monocytes Absolute: 0.7 10*3/uL (ref 0.1–0.9)
Monocytes: 13 %
Neutrophils Absolute: 2.5 10*3/uL (ref 1.4–7.0)
Neutrophils: 45 %
Platelets: 249 10*3/uL (ref 150–450)
RBC: 4.24 x10E6/uL (ref 3.77–5.28)
RDW: 13.2 % (ref 12.3–15.4)
WBC: 5.3 10*3/uL (ref 3.4–10.8)

## 2018-02-11 LAB — COMPREHENSIVE METABOLIC PANEL
ALT: 12 IU/L (ref 0–32)
AST: 18 IU/L (ref 0–40)
Albumin/Globulin Ratio: 1.5 (ref 1.2–2.2)
Albumin: 4.4 g/dL (ref 3.5–5.5)
Alkaline Phosphatase: 46 IU/L (ref 39–117)
BUN/Creatinine Ratio: 10 (ref 9–23)
BUN: 10 mg/dL (ref 6–20)
Bilirubin Total: 0.4 mg/dL (ref 0.0–1.2)
CO2: 22 mmol/L (ref 20–29)
Calcium: 9.2 mg/dL (ref 8.7–10.2)
Chloride: 105 mmol/L (ref 96–106)
Creatinine, Ser: 1 mg/dL (ref 0.57–1.00)
GFR calc Af Amer: 92 mL/min/{1.73_m2} (ref >59)
GFR calc non Af Amer: 80 mL/min/{1.73_m2} (ref >59)
Globulin, Total: 2.9 g/dL (ref 1.5–4.5)
Glucose: 82 mg/dL (ref 65–99)
Potassium: 4.4 mmol/L (ref 3.5–5.2)
Sodium: 142 mmol/L (ref 134–144)
Total Protein: 7.3 g/dL (ref 6.0–8.5)

## 2018-02-11 LAB — LIPID PANEL
Chol/HDL Ratio: 2.7 ratio (ref 0.0–4.4)
Cholesterol, Total: 143 mg/dL (ref 100–199)
HDL: 53 mg/dL (ref >39)
LDL Calculated: 80 mg/dL (ref 0–99)
Triglycerides: 52 mg/dL (ref 0–149)
VLDL Cholesterol Cal: 10 mg/dL (ref 5–40)

## 2018-02-11 LAB — TSH: TSH: 4.55 u[IU]/mL — ABNORMAL HIGH (ref 0.450–4.500)

## 2018-02-11 NOTE — Telephone Encounter (Signed)
Copied from CRM [id number]. Topic: Quick Communication - Lab Results >> Feb 11, 2018  9:46 AM Richarda Overlie, CMA wrote: Called patient to inform them of recent lab results. When patient returns call, triage nurse may disclose results. >> Feb 11, 2018  9:56 AM Herby Abraham C wrote: Pt is returning call for results.

## 2018-02-11 NOTE — Telephone Encounter (Signed)
Pt given results per notes of Osvaldo Angst, PA-C on 02/11/18, patient verbalized understanding. Unable to document in result note due to result note not being routed to [facility].

## 2018-12-05 ENCOUNTER — Encounter: Payer: Self-pay | Admitting: Physician Assistant

## 2019-01-19 ENCOUNTER — Telehealth: Payer: Self-pay | Admitting: Nurse Practitioner

## 2019-01-19 ENCOUNTER — Other Ambulatory Visit: Payer: Self-pay | Admitting: Nurse Practitioner

## 2019-01-19 DIAGNOSIS — Z111 Encounter for screening for respiratory tuberculosis: Secondary | ICD-10-CM

## 2019-01-19 NOTE — Progress Notes (Signed)
TB blood work order

## 2019-01-19 NOTE — Telephone Encounter (Signed)
Pt called in stating that she is needing tb blow drawn done for work. Scheduled pt for tomorrow can orders be put in?

## 2019-01-19 NOTE — Telephone Encounter (Signed)
Thank you :)

## 2019-01-19 NOTE — Telephone Encounter (Signed)
Lab order for Quantiferon placed.

## 2019-01-20 ENCOUNTER — Other Ambulatory Visit: Payer: Self-pay

## 2019-01-20 ENCOUNTER — Other Ambulatory Visit: Payer: 59

## 2019-01-20 DIAGNOSIS — Z111 Encounter for screening for respiratory tuberculosis: Secondary | ICD-10-CM

## 2019-01-23 LAB — QUANTIFERON-TB GOLD PLUS
QuantiFERON Mitogen Value: 1.53 IU/mL
QuantiFERON Nil Value: 0.02 IU/mL
QuantiFERON TB1 Ag Value: 0.02 IU/mL
QuantiFERON TB2 Ag Value: 0.01 IU/mL
QuantiFERON-TB Gold Plus: NEGATIVE

## 2019-01-25 ENCOUNTER — Other Ambulatory Visit: Payer: Self-pay

## 2019-01-25 ENCOUNTER — Encounter: Payer: Self-pay | Admitting: Nurse Practitioner

## 2019-01-25 ENCOUNTER — Ambulatory Visit: Payer: 59 | Admitting: Nurse Practitioner

## 2019-01-25 VITALS — BP 116/86 | HR 78 | Temp 98.0°F | Ht 64.0 in | Wt 140.0 lb

## 2019-01-25 DIAGNOSIS — Z0289 Encounter for other administrative examinations: Secondary | ICD-10-CM

## 2019-01-25 NOTE — Patient Instructions (Signed)
Debrox   Earwax Buildup, Adult The ears produce a substance called earwax that helps keep bacteria out of the ear and protects the skin in the ear canal. Occasionally, earwax can build up in the ear and cause discomfort or hearing loss. What increases the risk? This condition is more likely to develop in people who:  Are female.  Are elderly.  Naturally produce more earwax.  Clean their ears often with cotton swabs.  Use earplugs often.  Use in-ear headphones often.  Wear hearing aids.  Have narrow ear canals.  Have earwax that is overly thick or sticky.  Have eczema.  Are dehydrated.  Have excess hair in the ear canal. What are the signs or symptoms? Symptoms of this condition include:  Reduced or muffled hearing.  A feeling of fullness in the ear or feeling that the ear is plugged.  Fluid coming from the ear.  Ear pain.  Ear itch.  Ringing in the ear.  Coughing.  An obvious piece of earwax that can be seen inside the ear canal. How is this diagnosed? This condition may be diagnosed based on:  Your symptoms.  Your medical history.  An ear exam. During the exam, your health care provider will look into your ear with an instrument called an otoscope. You may have tests, including a hearing test. How is this treated? This condition may be treated by:  Using ear drops to soften the earwax.  Having the earwax removed by a health care provider. The health care provider may: ? Flush the ear with water. ? Use an instrument that has a loop on the end (curette). ? Use a suction device.  Surgery to remove the wax buildup. This may be done in severe cases. Follow these instructions at home:   Take over-the-counter and prescription medicines only as told by your health care provider.  Do not put any objects, including cotton swabs, into your ear. You can clean the opening of your ear canal with a washcloth or facial tissue.  Follow instructions from your  health care provider about cleaning your ears. Do not over-clean your ears.  Drink enough fluid to keep your urine clear or pale yellow. This will help to thin the earwax.  Keep all follow-up visits as told by your health care provider. If earwax builds up in your ears often or if you use hearing aids, consider seeing your health care provider for routine, preventive ear cleanings. Ask your health care provider how often you should schedule your cleanings.  If you have hearing aids, clean them according to instructions from the manufacturer and your health care provider. Contact a health care provider if:  You have ear pain.  You develop a fever.  You have blood, pus, or other fluid coming from your ear.  You have hearing loss.  You have ringing in your ears that does not go away.  Your symptoms do not improve with treatment.  You feel like the room is spinning (vertigo). Summary  Earwax can build up in the ear and cause discomfort or hearing loss.  The most common symptoms of this condition include reduced or muffled hearing and a feeling of fullness in the ear or feeling that the ear is plugged.  This condition may be diagnosed based on your symptoms, your medical history, and an ear exam.  This condition may be treated by using ear drops to soften the earwax or by having the earwax removed by a health care provider.  Do  not put any objects, including cotton swabs, into your ear. You can clean the opening of your ear canal with a washcloth or facial tissue. This information is not intended to replace advice given to you by your health care provider. Make sure you discuss any questions you have with your health care provider. Document Released: 07/02/2004 Document Revised: 05/07/2017 Document Reviewed: 08/05/2016 Elsevier Patient Education  2020 [employer]

## 2019-01-25 NOTE — Progress Notes (Signed)
BP 116/86   Pulse 78   Temp 98 F (36.7 C) (Oral)   Ht 5\' 4"  (1.626 m)   Wt 140 lb (63.5 kg)   SpO2 98%   BMI 24.03 kg/m    Subjective:    Patient ID: Zoe Ramsey, female    DOB: 10-Dec-1995, 23 y.o.   MRN: [id number]  HPI: Zoe Ramsey is a 23 y.o. female  Chief Complaint  Patient presents with  . Paperwork    for school and work   PAPERWORK FOR SCHOOL AND WORK: Patient applying for teaching position for science, [employer], and also applying for nursing school.  Forms to fill out.  She denies any chronic illnesses or allergies.  No complaints today.  Relevant past medical, surgical, family and social history reviewed and updated as indicated. Interim medical history since our last visit reviewed. Allergies and medications reviewed and updated.  Review of Systems  Constitutional: Negative for activity change, appetite change, diaphoresis, fatigue and fever.  Respiratory: Negative for cough, chest tightness and shortness of breath.   Cardiovascular: Negative for chest pain, palpitations and leg swelling.  Gastrointestinal: Negative for abdominal distention, abdominal pain, constipation, diarrhea, nausea and vomiting.  Endocrine: Negative for cold intolerance and heat intolerance.  Neurological: Negative for dizziness, syncope, weakness, light-headedness, numbness and headaches.  Psychiatric/Behavioral: Negative.     Per HPI unless specifically indicated above     Objective:    BP 116/86   Pulse 78   Temp 98 F (36.7 C) (Oral)   Ht 5\' 4"  (1.626 m)   Wt 140 lb (63.5 kg)   SpO2 98%   BMI 24.03 kg/m   Wt Readings from Last 3 Encounters:  01/25/19 140 lb (63.5 kg)  02/10/18 130 lb 3.2 oz (59.1 kg)  01/06/17 130 lb 3.2 oz (59.1 kg)    Physical Exam Vitals signs and nursing note reviewed.  Constitutional:      General: She is awake. She is not in acute distress.    Appearance: She is well-developed. She is not ill-appearing.  HENT:     Head:  Normocephalic.     Right Ear: Hearing, tympanic membrane, ear canal and external ear normal.     Left Ear: Hearing, tympanic membrane, ear canal and external ear normal.     Nose: Nose normal.     Mouth/Throat:     Mouth: Mucous membranes are moist.     Pharynx: Oropharynx is clear. Uvula midline.  Eyes:     General: Lids are normal.        Right eye: No discharge.        Left eye: No discharge.     Conjunctiva/sclera: Conjunctivae normal.     Pupils: Pupils are equal, round, and reactive to light.  Neck:     Musculoskeletal: Normal range of motion and neck supple.     Thyroid: No thyromegaly.     Vascular: No carotid bruit.  Cardiovascular:     Rate and Rhythm: Normal rate and regular rhythm.     Heart sounds: Normal heart sounds. No murmur. No gallop.   Pulmonary:     Effort: Pulmonary effort is normal. No accessory muscle usage or respiratory distress.     Breath sounds: Normal breath sounds.  Abdominal:     General: Bowel sounds are normal.     Palpations: Abdomen is soft. There is no hepatomegaly or splenomegaly.     Tenderness: There is no abdominal tenderness.  Musculoskeletal:  Right lower leg: No edema.     Left lower leg: No edema.  Lymphadenopathy:     Cervical: No cervical adenopathy.  Skin:    General: Skin is warm and dry.  Neurological:     Mental Status: She is alert and oriented to person, place, and time.     Deep Tendon Reflexes: Reflexes are normal and symmetric.  Psychiatric:        Attention and Perception: Attention normal.        Mood and Affect: Mood normal.        Behavior: Behavior normal. Behavior is cooperative.        Thought Content: Thought content normal.        Judgment: Judgment normal.     Results for orders placed or performed in visit on 01/20/19  QuantiFERON-TB Gold Plus  Result Value Ref Range   QuantiFERON Incubation Incubation performed.    QuantiFERON Criteria Comment    QuantiFERON TB1 Ag Value 0.02 IU/mL   QuantiFERON  TB2 Ag Value 0.01 IU/mL   QuantiFERON Nil Value 0.02 IU/mL   QuantiFERON Mitogen Value 1.53 IU/mL   QuantiFERON-TB Gold Plus Negative Negative      Assessment & Plan:   Problem List Items Addressed This Visit    None    Visit Diagnoses    Encounter for review of form with patient    -  Primary   Physical and forms completed.  Patient denies complaints.       Follow up plan: Return if symptoms worsen or fail to improve.

## 2019-03-07 ENCOUNTER — Encounter: Payer: 59 | Admitting: Nurse Practitioner

## 2019-03-31 ENCOUNTER — Ambulatory Visit (INDEPENDENT_AMBULATORY_CARE_PROVIDER_SITE_OTHER): Payer: 59 | Admitting: Nurse Practitioner

## 2019-03-31 ENCOUNTER — Other Ambulatory Visit: Payer: Self-pay

## 2019-03-31 ENCOUNTER — Encounter: Payer: Self-pay | Admitting: Nurse Practitioner

## 2019-03-31 VITALS — BP 130/88 | HR 73 | Temp 98.6°F | Ht 64.2 in | Wt 136.0 lb

## 2019-03-31 DIAGNOSIS — Z23 Encounter for immunization: Secondary | ICD-10-CM

## 2019-03-31 DIAGNOSIS — Z0000 Encounter for general adult medical examination without abnormal findings: Secondary | ICD-10-CM | POA: Diagnosis not present

## 2019-03-31 NOTE — Progress Notes (Signed)
BP 130/88   Pulse 73   Temp 98.6 F (37 C) (Oral)   Ht 5' 4.2" (1.631 m)   Wt 136 lb (61.7 kg)   LMP 03/27/2019 (Exact Date)   SpO2 99%   BMI 23.20 kg/m    Subjective:    Patient ID: Zoe Ramsey, female    DOB: 02-Jan-1996, 23 y.o.   MRN: [id number]  HPI: Zoe Ramsey is a 23 y.o. female presenting on 03/31/2019 for comprehensive medical examination. Current medical complaints include:none  She currently lives with: parents Menopausal Symptoms: no  Depression Screen done today and results listed below:  Depression screen [facility] 2/9 03/31/2019 02/10/2018 01/06/2017 12/26/2014  Decreased Interest 0 0 0 0  Down, Depressed, Hopeless 0 0 0 0  PHQ - 2 Score 0 0 0 0  Altered sleeping - 0 - -  Tired, decreased energy - 0 - -  Change in appetite - 0 - -  Feeling bad or failure about yourself  - 0 - -  Trouble concentrating - 0 - -  Moving slowly or fidgety/restless - 0 - -  Suicidal thoughts - 0 - -  PHQ-9 Score - 0 - -    The patient does not have a history of falls. I did not complete a risk assessment for falls. A plan of care for falls was not documented.   Past Medical History:  History reviewed. No pertinent past medical history.  Surgical History:  Past Surgical History:  Procedure Laterality Date  . WISDOM TOOTH EXTRACTION      Medications:  No current outpatient medications on file prior to visit.   No current facility-administered medications on file prior to visit.     Allergies:  No Known Allergies  Social History:  Social History   Socioeconomic History  . Marital status: Single    Spouse name: Not on file  . Number of children: Not on file  . Years of education: Not on file  . Highest education level: Not on file  Occupational History  . Not on file  Social Needs  . Financial resource strain: Not on file  . Food insecurity    Worry: Not on file    Inability: Not on file  . Transportation needs    Medical: Not on file   Non-medical: Not on file  Tobacco Use  . Smoking status: Never Smoker  . Smokeless tobacco: Never Used  Substance and Sexual Activity  . Alcohol use: No    Alcohol/week: 0.0 standard drinks  . Drug use: No  . Sexual activity: Never  Lifestyle  . Physical activity    Days per week: Not on file    Minutes per session: Not on file  . Stress: Not on file  Relationships  . Social Musician on phone: Not on file    Gets together: Not on file    Attends religious service: Not on file    Active member of club or organization: Not on file    Attends meetings of clubs or organizations: Not on file    Relationship status: Not on file  . Intimate partner violence    Fear of current or ex partner: Not on file    Emotionally abused: Not on file    Physically abused: Not on file    Forced sexual activity: Not on file  Other Topics Concern  . Not on file  Social History Narrative  . Not on file   Social  History   Tobacco Use  Smoking Status Never Smoker  Smokeless Tobacco Never Used   Social History   Substance and Sexual Activity  Alcohol Use No  . Alcohol/week: 0.0 standard drinks    Family History:  Family History  Problem Relation Age of Onset  . Hypertension Father   . Heart disease Maternal Grandmother        CHF  . Arthritis Maternal Grandmother     Past medical history, surgical history, medications, allergies, family history and social history reviewed with patient today and changes made to appropriate areas of the chart.   Review of Systems -  negative All other ROS negative except what is listed above and in the HPI.      Objective:    BP 130/88   Pulse 73   Temp 98.6 F (37 C) (Oral)   Ht 5' 4.2" (1.631 m)   Wt 136 lb (61.7 kg)   LMP 03/27/2019 (Exact Date)   SpO2 99%   BMI 23.20 kg/m   Wt Readings from Last 3 Encounters:  03/31/19 136 lb (61.7 kg)  01/25/19 140 lb (63.5 kg)  02/10/18 130 lb 3.2 oz (59.1 kg)    Physical Exam  Constitutional:      General: She is awake. She is not in acute distress.    Appearance: She is well-developed. She is not ill-appearing.  HENT:     Head: Normocephalic and atraumatic.     Right Ear: Hearing, tympanic membrane, ear canal and external ear normal. No drainage.     Left Ear: Hearing, tympanic membrane, ear canal and external ear normal. No drainage.     Nose: Nose normal.     Right Sinus: No maxillary sinus tenderness or frontal sinus tenderness.     Left Sinus: No maxillary sinus tenderness or frontal sinus tenderness.     Mouth/Throat:     Mouth: Mucous membranes are moist.     Pharynx: Oropharynx is clear. Uvula midline. No pharyngeal swelling, oropharyngeal exudate or posterior oropharyngeal erythema.  Eyes:     General: Lids are normal.        Right eye: No discharge.        Left eye: No discharge.     Extraocular Movements: Extraocular movements intact.     Conjunctiva/sclera: Conjunctivae normal.     Pupils: Pupils are equal, round, and reactive to light.     Visual Fields: Right eye visual fields normal and left eye visual fields normal.  Neck:     Musculoskeletal: Normal range of motion and neck supple.     Thyroid: No thyromegaly.     Vascular: No carotid bruit.     Trachea: Trachea normal.  Cardiovascular:     Rate and Rhythm: Normal rate and regular rhythm.     Heart sounds: Normal heart sounds. No murmur. No gallop.   Pulmonary:     Effort: Pulmonary effort is normal. No accessory muscle usage or respiratory distress.     Breath sounds: Normal breath sounds.  Chest:     Breasts:        Right: Normal.        Left: Normal.  Abdominal:     General: Bowel sounds are normal.     Palpations: Abdomen is soft. There is no hepatomegaly or splenomegaly.     Tenderness: There is no abdominal tenderness.  Musculoskeletal: Normal range of motion.     Right lower leg: No edema.     Left lower leg: No  edema.  Lymphadenopathy:     Head:     Right side of head:  No submental, submandibular, tonsillar, preauricular or posterior auricular adenopathy.     Left side of head: No submental, submandibular, tonsillar, preauricular or posterior auricular adenopathy.     Cervical: No cervical adenopathy.     Upper Body:     Right upper body: No supraclavicular, axillary or pectoral adenopathy.     Left upper body: No supraclavicular, axillary or pectoral adenopathy.  Skin:    General: Skin is warm and dry.     Capillary Refill: Capillary refill takes less than 2 seconds.     Findings: No rash.  Neurological:     Mental Status: She is alert and oriented to person, place, and time.     Cranial Nerves: Cranial nerves are intact.     Gait: Gait is intact.     Deep Tendon Reflexes: Reflexes are normal and symmetric.     Reflex Scores:      Brachioradialis reflexes are 2+ on the right side and 2+ on the left side.      Patellar reflexes are 2+ on the right side and 2+ on the left side. Psychiatric:        Attention and Perception: Attention normal.        Mood and Affect: Mood normal.        Speech: Speech normal.        Behavior: Behavior normal. Behavior is cooperative.        Thought Content: Thought content normal.        Judgment: Judgment normal.     Results for orders placed or performed in visit on 01/20/19  QuantiFERON-TB Gold Plus  Result Value Ref Range   QuantiFERON Incubation Incubation performed.    QuantiFERON Criteria Comment    QuantiFERON TB1 Ag Value 0.02 IU/mL   QuantiFERON TB2 Ag Value 0.01 IU/mL   QuantiFERON Nil Value 0.02 IU/mL   QuantiFERON Mitogen Value 1.53 IU/mL   QuantiFERON-TB Gold Plus Negative Negative      Assessment & Plan:   Problem List Items Addressed This Visit    None    Visit Diagnoses    Annual physical exam    -  Primary   Healthy female on physical exam.  Will obtain yearly labs to include lipid, CMP, TSH, CBC.   Relevant Orders   CBC with Differential/Platelet   Comprehensive metabolic panel    Lipid Panel w/o Chol/HDL Ratio   TSH   Need for influenza vaccination       Relevant Orders   Flu Vaccine QUAD 36+ mos IM       Follow up plan: Return in about 1 year (around 03/30/2020) for Annual physical.   LABORATORY TESTING:  - Pap smear: up to date  IMMUNIZATIONS:   - Tdap: Tetanus vaccination status reviewed: last tetanus booster within 10 years. - Influenza: Up to date - Pneumovax: Not applicable - Prevnar: Not applicable - HPV: Up to date - Zostavax vaccine: not applicable  SCREENING: -Mammogram: Not applicable  - Colonoscopy: Not applicable  - Bone Density: Not applicable  -Hearing Test: Not applicable  -Spirometry: Not applicable   PATIENT COUNSELING:   Advised to take 1 mg of folate supplement per day if capable of pregnancy.   Sexuality: Discussed sexually transmitted diseases, partner selection, use of condoms, avoidance of unintended pregnancy  and contraceptive alternatives.   Advised to avoid cigarette smoking.  I discussed with the patient that most  people either abstain from alcohol or drink within safe limits (<=14/week and <=4 drinks/occasion for males, <=7/weeks and <= 3 drinks/occasion for females) and that the risk for alcohol disorders and other health effects rises proportionally with the number of drinks per week and how often a drinker exceeds daily limits.  Discussed cessation/primary prevention of drug use and availability of treatment for abuse.   Diet: Encouraged to adjust caloric intake to maintain  or achieve ideal body weight, to reduce intake of dietary saturated fat and total fat, to limit sodium intake by avoiding high sodium foods and not adding table salt, and to maintain adequate dietary potassium and calcium preferably from fresh fruits, vegetables, and low-fat dairy products.    stressed the importance of regular exercise  Injury prevention: Discussed safety belts, safety helmets, smoke detector, smoking near bedding or  upholstery.   Dental health: Discussed importance of regular tooth brushing, flossing, and dental visits.    NEXT PREVENTATIVE PHYSICAL DUE IN 1 YEAR. Return in about 1 year (around 03/30/2020) for Annual physical.

## 2019-03-31 NOTE — Patient Instructions (Addendum)
DEBROX TO CLEAN EARS  Influenza (Flu) Vaccine (Inactivated or Recombinant): What You Need to Know 1. Why get vaccinated? Influenza vaccine can prevent influenza (flu). Flu is a contagious disease that spreads around the [place] every year, usually between October and May. Anyone can get the flu, but it is more dangerous for some people. Infants and young children, people 24 years of age and older, pregnant women, and people with certain health conditions or a weakened immune system are at greatest risk of flu complications. Pneumonia, bronchitis, sinus infections and ear infections are examples of flu-related complications. If you have a medical condition, such as heart disease, cancer or diabetes, flu can make it worse. Flu can cause fever and chills, sore throat, muscle aches, fatigue, cough, headache, and runny or stuffy nose. Some people may have vomiting and diarrhea, though this is more common in children than adults. Each year thousands of people in the [place] die from flu, and many more are hospitalized. Flu vaccine prevents millions of illnesses and flu-related visits to the doctor each year. 2. Influenza vaccine CDC recommends everyone 78 months of age and older get vaccinated every flu season. Children 6 months through 80 years of age may need 2 doses during a single flu season. Everyone else needs only 1 dose each flu season. It takes about 2 weeks for protection to develop after vaccination. There are many flu viruses, and they are always changing. Each year a new flu vaccine is made to protect against three or four viruses that are likely to cause disease in the upcoming flu season. Even when the vaccine doesn't exactly match these viruses, it may still provide some protection. Influenza vaccine does not cause flu. Influenza vaccine may be given at the same time as other vaccines. 3. Talk with your health care provider Tell your vaccine provider if the person getting the  vaccine:  Has had an allergic reaction after a previous dose of influenza vaccine, or has any severe, life-threatening allergies.  Has ever had Guillain-Barr Syndrome (also called GBS). In some cases, your health care provider may decide to postpone influenza vaccination to a future visit. People with minor illnesses, such as a cold, may be vaccinated. People who are moderately or severely ill should usually wait until they recover before getting influenza vaccine. Your health care provider can give you more information. 4. Risks of a vaccine reaction  Soreness, redness, and swelling where shot is given, fever, muscle aches, and headache can happen after influenza vaccine.  There may be a very small increased risk of Guillain-Barr Syndrome (GBS) after inactivated influenza vaccine (the flu shot). Young children who get the flu shot along with pneumococcal vaccine (PCV13), and/or DTaP vaccine at the same time might be slightly more likely to have a seizure caused by fever. Tell your health care provider if a child who is getting flu vaccine has ever had a seizure. People sometimes faint after medical procedures, including vaccination. Tell your provider if you feel dizzy or have vision changes or ringing in the ears. As with any medicine, there is a very remote chance of a vaccine causing a severe allergic reaction, other serious injury, or death. 5. What if there is a serious problem? An allergic reaction could occur after the vaccinated person leaves the clinic. If you see signs of a severe allergic reaction (hives, swelling of the face and throat, difficulty breathing, a fast heartbeat, dizziness, or weakness), call 9-1-1 and get the person to the nearest  hospital. For other signs that concern you, call your health care provider. Adverse reactions should be reported to the Vaccine Adverse Event Reporting System (VAERS). Your health care provider will usually file this report, or you can do it  yourself. Visit the VAERS website at www.vaers.SamedayNews.es or call 9063526073.VAERS is only for reporting reactions, and VAERS staff do not give medical advice. 6. The National Vaccine Injury Compensation Program The [employer] Vaccine Injury Compensation Program (VICP) is a federal program that was created to compensate people who may have been injured by certain vaccines. Visit the VICP website at GoldCloset.com.ee or call 216-230-6602 to learn about the program and about filing a claim. There is a time limit to file a claim for compensation. 7. How can I learn more?  Ask your healthcare provider.  Call your local or state health department.  Contact the Centers for Disease Control and Prevention (CDC): ? Call 760-560-3282 (1-800-CDC-INFO) or ? Visit CDC's https://gibson.com/ Vaccine Information Statement (Interim) Inactivated Influenza Vaccine (01/20/2018) This information is not intended to replace advice given to you by your health care provider. Make sure you discuss any questions you have with your health care provider. Document Released: 03/19/2006 Document Revised: 09/13/2018 Document Reviewed: 01/24/2018 Elsevier Patient Education  2020 [employer].

## 2019-04-01 LAB — CBC WITH DIFFERENTIAL/PLATELET
Basophils Absolute: 0 10*3/uL (ref 0.0–0.2)
Basos: 1 %
EOS (ABSOLUTE): 0.1 10*3/uL (ref 0.0–0.4)
Eos: 2 %
Hematocrit: 37.1 % (ref 34.0–46.6)
Hemoglobin: 12.3 g/dL (ref 11.1–15.9)
Immature Grans (Abs): 0 10*3/uL (ref 0.0–0.1)
Immature Granulocytes: 0 %
Lymphocytes Absolute: 1.9 10*3/uL (ref 0.7–3.1)
Lymphs: 33 %
MCH: 28.5 pg (ref 26.6–33.0)
MCHC: 33.2 g/dL (ref 31.5–35.7)
MCV: 86 fL (ref 79–97)
Monocytes Absolute: 0.6 10*3/uL (ref 0.1–0.9)
Monocytes: 10 %
Neutrophils Absolute: 3.1 10*3/uL (ref 1.4–7.0)
Neutrophils: 54 %
Platelets: 334 10*3/uL (ref 150–450)
RBC: 4.31 x10E6/uL (ref 3.77–5.28)
RDW: 13.2 % (ref 11.7–15.4)
WBC: 5.7 10*3/uL (ref 3.4–10.8)

## 2019-04-01 LAB — LIPID PANEL W/O CHOL/HDL RATIO
Cholesterol, Total: 195 mg/dL (ref 100–199)
HDL: 55 mg/dL (ref >39)
LDL Chol Calc (NIH): 131 mg/dL — ABNORMAL HIGH (ref 0–99)
Triglycerides: 49 mg/dL (ref 0–149)
VLDL Cholesterol Cal: 9 mg/dL (ref 5–40)

## 2019-04-01 LAB — COMPREHENSIVE METABOLIC PANEL
ALT: 10 IU/L (ref 0–32)
AST: 16 IU/L (ref 0–40)
Albumin/Globulin Ratio: 1.5 (ref 1.2–2.2)
Albumin: 4.2 g/dL (ref 3.9–5.0)
Alkaline Phosphatase: 53 IU/L (ref 39–117)
BUN/Creatinine Ratio: 10 (ref 9–23)
BUN: 10 mg/dL (ref 6–20)
Bilirubin Total: <0.2 mg/dL (ref 0.0–1.2)
CO2: 23 mmol/L (ref 20–29)
Calcium: 9 mg/dL (ref 8.7–10.2)
Chloride: 102 mmol/L (ref 96–106)
Creatinine, Ser: 1.04 mg/dL — ABNORMAL HIGH (ref 0.57–1.00)
GFR calc Af Amer: 87 mL/min/{1.73_m2} (ref >59)
GFR calc non Af Amer: 75 mL/min/{1.73_m2} (ref >59)
Globulin, Total: 2.8 g/dL (ref 1.5–4.5)
Glucose: 84 mg/dL (ref 65–99)
Potassium: 3.8 mmol/L (ref 3.5–5.2)
Sodium: 139 mmol/L (ref 134–144)
Total Protein: 7 g/dL (ref 6.0–8.5)

## 2019-04-01 LAB — TSH: TSH: 2.62 u[IU]/mL (ref 0.450–4.500)

## 2020-02-16 ENCOUNTER — Encounter: Payer: 59 | Admitting: Nurse Practitioner

## 2020-02-16 ENCOUNTER — Ambulatory Visit (INDEPENDENT_AMBULATORY_CARE_PROVIDER_SITE_OTHER): Payer: 59

## 2020-02-16 ENCOUNTER — Other Ambulatory Visit: Payer: Self-pay

## 2020-02-16 DIAGNOSIS — Z23 Encounter for immunization: Secondary | ICD-10-CM

## 2020-04-02 ENCOUNTER — Encounter: Payer: 59 | Admitting: Nurse Practitioner

## 2020-04-05 ENCOUNTER — Ambulatory Visit (INDEPENDENT_AMBULATORY_CARE_PROVIDER_SITE_OTHER): Payer: 59 | Admitting: Nurse Practitioner

## 2020-04-05 ENCOUNTER — Other Ambulatory Visit: Payer: Self-pay

## 2020-04-05 ENCOUNTER — Encounter: Payer: Self-pay | Admitting: Nurse Practitioner

## 2020-04-05 VITALS — BP 114/77 | HR 86 | Temp 98.1°F | Resp 16 | Ht 64.0 in | Wt 145.8 lb

## 2020-04-05 DIAGNOSIS — Z0000 Encounter for general adult medical examination without abnormal findings: Secondary | ICD-10-CM

## 2020-04-05 DIAGNOSIS — Z1329 Encounter for screening for other suspected endocrine disorder: Secondary | ICD-10-CM

## 2020-04-05 DIAGNOSIS — Z13220 Encounter for screening for lipoid disorders: Secondary | ICD-10-CM | POA: Diagnosis not present

## 2020-04-05 NOTE — Progress Notes (Addendum)
BP 114/77 (BP Location: Left Arm, Patient Position: Sitting, Cuff Size: Normal)   Pulse 86   Temp 98.1 F (36.7 C) (Oral)   Resp 16   Ht 5\' 4"  (1.626 m)   Wt 145 lb 12.8 oz (66.1 kg)   LMP 03/25/2020 (Approximate)   SpO2 99%   BMI 25.03 kg/m    Subjective:    Patient ID: 03/27/2020, female    DOB: 1996-05-16, 24 y.o.   MRN: [id number]  HPI: Zoe Ramsey is a 24 y.o. female presenting on 04/05/2020 for comprehensive medical examination. Current medical complaints include:none  She currently lives with: parents Menopausal Symptoms: no  Depression Screen done today and results listed below:  Depression screen [facility] 2/9 04/05/2020 03/31/2019 02/10/2018 01/06/2017 12/26/2014  Decreased Interest 0 0 0 0 0  Down, Depressed, Hopeless 0 0 0 0 0  PHQ - 2 Score 0 0 0 0 0  Altered sleeping - - 0 - -  Tired, decreased energy - - 0 - -  Change in appetite - - 0 - -  Feeling bad or failure about yourself  - - 0 - -  Trouble concentrating - - 0 - -  Moving slowly or fidgety/restless - - 0 - -  Suicidal thoughts - - 0 - -  PHQ-9 Score - - 0 - -    The patient does not have a history of falls. I did not complete a risk assessment for falls. A plan of care for falls was not documented.   Past Medical History:  History reviewed. No pertinent past medical history.  Surgical History:  Past Surgical History:  Procedure Laterality Date  . WISDOM TOOTH EXTRACTION      Medications:  No current outpatient medications on file prior to visit.   No current facility-administered medications on file prior to visit.    Allergies:  No Known Allergies  Social History:  Social History   Socioeconomic History  . Marital status: Single    Spouse name: Not on file  . Number of children: Not on file  . Years of education: Not on file  . Highest education level: Not on file  Occupational History  . Not on file  Tobacco Use  . Smoking status: Never Smoker  . Smokeless tobacco:  Never Used  Vaping Use  . Vaping Use: Never used  Substance and Sexual Activity  . Alcohol use: No    Alcohol/week: 0.0 standard drinks  . Drug use: No  . Sexual activity: Never  Other Topics Concern  . Not on file  Social History Narrative  . Not on file   Social Determinants of Health   Financial Resource Strain:   . Difficulty of Paying Living Expenses: Not on file  Food Insecurity:   . Worried About 12/28/2014 in the Last Year: Not on file  . Ran Out of Food in the Last Year: Not on file  Transportation Needs:   . Lack of Transportation (Medical): Not on file  . Lack of Transportation (Non-Medical): Not on file  Physical Activity:   . Days of Exercise per Week: Not on file  . Minutes of Exercise per Session: Not on file  Stress:   . Feeling of Stress : Not on file  Social Connections:   . Frequency of Communication with Friends and Family: Not on file  . Frequency of Social Gatherings with Friends and Family: Not on file  . Attends Religious Services: Not on file  .  Active Member of Clubs or Organizations: Not on file  . Attends Banker Meetings: Not on file  . Marital Status: Not on file  Intimate Partner Violence:   . Fear of Current or Ex-Partner: Not on file  . Emotionally Abused: Not on file  . Physically Abused: Not on file  . Sexually Abused: Not on file   Social History   Tobacco Use  Smoking Status Never Smoker  Smokeless Tobacco Never Used   Social History   Substance and Sexual Activity  Alcohol Use No  . Alcohol/week: 0.0 standard drinks    Family History:  Family History  Problem Relation Age of Onset  . Hypertension Father   . Hyperlipidemia Father   . GER disease Father   . Hypertension Brother   . GER disease Brother   . Heart disease Maternal Grandmother        CHF  . Arthritis Maternal Grandmother   . Heart disease Maternal Grandfather   . Hypertension Maternal Grandfather   . Parkinson's disease Paternal  Grandmother   . Dementia Paternal Grandfather   . Healthy Brother     Past medical history, surgical history, medications, allergies, family history and social history reviewed with patient today and changes made to appropriate areas of the chart.   Review of Systems -  negative All other ROS negative except what is listed above and in the HPI.      Objective:    BP 114/77 (BP Location: Left Arm, Patient Position: Sitting, Cuff Size: Normal)   Pulse 86   Temp 98.1 F (36.7 C) (Oral)   Resp 16   Ht 5\' 4"  (1.626 m)   Wt 145 lb 12.8 oz (66.1 kg)   LMP 03/25/2020 (Approximate)   SpO2 99%   BMI 25.03 kg/m   Wt Readings from Last 3 Encounters:  04/05/20 145 lb 12.8 oz (66.1 kg)  03/31/19 136 lb (61.7 kg)  01/25/19 140 lb (63.5 kg)    Physical Exam Constitutional:      General: She is awake. She is not in acute distress.    Appearance: She is well-developed. She is not ill-appearing.  HENT:     Head: Normocephalic and atraumatic.     Right Ear: Hearing, tympanic membrane, ear canal and external ear normal. No drainage.     Left Ear: Hearing, tympanic membrane, ear canal and external ear normal. No drainage.     Nose: Nose normal.     Right Sinus: No maxillary sinus tenderness or frontal sinus tenderness.     Left Sinus: No maxillary sinus tenderness or frontal sinus tenderness.     Mouth/Throat:     Mouth: Mucous membranes are moist.     Pharynx: Oropharynx is clear. Uvula midline. No pharyngeal swelling, oropharyngeal exudate or posterior oropharyngeal erythema.  Eyes:     General: Lids are normal.        Right eye: No discharge.        Left eye: No discharge.     Extraocular Movements: Extraocular movements intact.     Conjunctiva/sclera: Conjunctivae normal.     Pupils: Pupils are equal, round, and reactive to light.     Visual Fields: Right eye visual fields normal and left eye visual fields normal.  Neck:     Thyroid: No thyromegaly.     Vascular: No carotid bruit.       Trachea: Trachea normal.  Cardiovascular:     Rate and Rhythm: Normal rate and regular rhythm.  Heart sounds: Normal heart sounds. No murmur heard.  No gallop.   Pulmonary:     Effort: Pulmonary effort is normal. No accessory muscle usage or respiratory distress.     Breath sounds: Normal breath sounds.  Chest:     Breasts:        Right: Normal.        Left: Normal.  Abdominal:     General: Bowel sounds are normal.     Palpations: Abdomen is soft. There is no hepatomegaly or splenomegaly.     Tenderness: There is no abdominal tenderness.  Musculoskeletal:        General: Normal range of motion.     Cervical back: Normal range of motion and neck supple.     Right lower leg: No edema.     Left lower leg: No edema.  Lymphadenopathy:     Head:     Right side of head: No submental, submandibular, tonsillar, preauricular or posterior auricular adenopathy.     Left side of head: No submental, submandibular, tonsillar, preauricular or posterior auricular adenopathy.     Cervical: No cervical adenopathy.     Upper Body:     Right upper body: No supraclavicular, axillary or pectoral adenopathy.     Left upper body: No supraclavicular, axillary or pectoral adenopathy.  Skin:    General: Skin is warm and dry.     Capillary Refill: Capillary refill takes less than 2 seconds.     Findings: No rash.  Neurological:     Mental Status: She is alert and oriented to person, place, and time.     Cranial Nerves: Cranial nerves are intact.     Gait: Gait is intact.     Deep Tendon Reflexes: Reflexes are normal and symmetric.     Reflex Scores:      Brachioradialis reflexes are 2+ on the right side and 2+ on the left side.      Patellar reflexes are 2+ on the right side and 2+ on the left side. Psychiatric:        Attention and Perception: Attention normal.        Mood and Affect: Mood normal.        Speech: Speech normal.        Behavior: Behavior normal. Behavior is cooperative.         Thought Content: Thought content normal.        Judgment: Judgment normal.    Results for orders placed or performed in visit on 03/31/19  CBC with Differential/Platelet  Result Value Ref Range   WBC 5.7 3.4 - 10.8 x10E3/uL   RBC 4.31 3.77 - 5.28 x10E6/uL   Hemoglobin 12.3 11.1 - 15.9 g/dL   Hematocrit 44.8 18.5 - 46.6 %   MCV 86 79 - 97 fL   MCH 28.5 26.6 - 33.0 pg   MCHC 33.2 31 - 35 g/dL   RDW 63.1 49.7 - 02.6 %   Platelets 334 150 - 450 x10E3/uL   Neutrophils 54 Not Estab. %   Lymphs 33 Not Estab. %   Monocytes 10 Not Estab. %   Eos 2 Not Estab. %   Basos 1 Not Estab. %   Neutrophils Absolute 3.1 1.40 - 7.00 x10E3/uL   Lymphocytes Absolute 1.9 0 - 3 x10E3/uL   Monocytes Absolute 0.6 0 - 0 x10E3/uL   EOS (ABSOLUTE) 0.1 0.0 - 0.4 x10E3/uL   Basophils Absolute 0.0 0 - 0 x10E3/uL   Immature Granulocytes 0 Not Estab. %  Immature Grans (Abs) 0.0 0.0 - 0.1 x10E3/uL  Comprehensive metabolic panel  Result Value Ref Range   Glucose 84 65 - 99 mg/dL   BUN 10 6 - 20 mg/dL   Creatinine, Ser 4.091.04 (H) 0.57 - 1.00 mg/dL   GFR calc non Af Amer 75 >59 mL/min/1.73   GFR calc Af Amer 87 >59 mL/min/1.73   BUN/Creatinine Ratio 10 9 - 23   Sodium 139 134 - 144 mmol/L   Potassium 3.8 3.5 - 5.2 mmol/L   Chloride 102 96 - 106 mmol/L   CO2 23 20 - 29 mmol/L   Calcium 9.0 8.7 - 10.2 mg/dL   Total Protein 7.0 6.0 - 8.5 g/dL   Albumin 4.2 3.9 - 5.0 g/dL   Globulin, Total 2.8 1.5 - 4.5 g/dL   Albumin/Globulin Ratio 1.5 1.2 - 2.2   Bilirubin Total <0.2 0.0 - 1.2 mg/dL   Alkaline Phosphatase 53 39 - 117 IU/L   AST 16 0 - 40 IU/L   ALT 10 0 - 32 IU/L  Lipid Panel w/o Chol/HDL Ratio  Result Value Ref Range   Cholesterol, Total 195 100 - 199 mg/dL   Triglycerides 49 0 - 149 mg/dL   HDL 55 >81>39 mg/dL   VLDL Cholesterol Cal 9 5 - 40 mg/dL   LDL Chol Calc (NIH) 191131 (H) 0 - 99 mg/dL  TSH  Result Value Ref Range   TSH 2.620 0.450 - 4.500 uIU/mL      Assessment & Plan:   Problem List Items  Addressed This Visit    None    Visit Diagnoses    Encounter for annual physical exam    -  Primary   Annual labs today to include CBC, CMP, TSH, lipid   Relevant Orders   CBC with Differential   Comprehensive metabolic panel   Lipid panel   Screening cholesterol level       Lipid panel today   Relevant Orders   Lipid panel   Thyroid disorder screen       Thyroid disorder screening   Relevant Orders   TSH       Follow up plan: Return in 1 year (on 04/05/2021) for Annual physical.   LABORATORY TESTING:  - Pap smear: obtain next visit  IMMUNIZATIONS:   - Tdap: Tetanus vaccination status reviewed: last tetanus booster within 10 years. - Influenza: Up to date - Pneumovax: Not applicable - Prevnar: Not applicable - HPV: Up to date - Zostavax vaccine: not applicable  SCREENING: -Mammogram: Not applicable  - Colonoscopy: Not applicable  - Bone Density: Not applicable  -Hearing Test: Not applicable  -Spirometry: Not applicable   PATIENT COUNSELING:   Advised to take 1 mg of folate supplement per day if capable of pregnancy.   Sexuality: Discussed sexually transmitted diseases, partner selection, use of condoms, avoidance of unintended pregnancy  and contraceptive alternatives.   Advised to avoid cigarette smoking.  I discussed with the patient that most people either abstain from alcohol or drink within safe limits (<=14/week and <=4 drinks/occasion for males, <=7/weeks and <= 3 drinks/occasion for females) and that the risk for alcohol disorders and other health effects rises proportionally with the number of drinks per week and how often a drinker exceeds daily limits.  Discussed cessation/primary prevention of drug use and availability of treatment for abuse.   Diet: Encouraged to adjust caloric intake to maintain  or achieve ideal body weight, to reduce intake of dietary saturated fat and total fat, to  limit sodium intake by avoiding high sodium foods and not adding  table salt, and to maintain adequate dietary potassium and calcium preferably from fresh fruits, vegetables, and low-fat dairy products.    stressed the importance of regular exercise  Injury prevention: Discussed safety belts, safety helmets, smoke detector, smoking near bedding or upholstery.   Dental health: Discussed importance of regular tooth brushing, flossing, and dental visits.    NEXT PREVENTATIVE PHYSICAL DUE IN 1 YEAR. Return in 1 year (on 04/05/2021) for Annual physical.

## 2020-04-05 NOTE — Patient Instructions (Signed)
Health Maintenance, Female Adopting a healthy lifestyle and getting preventive care are important in promoting health and wellness. Ask your health care provider about:  The right schedule for you to have regular tests and exams.  Things you can do on your own to prevent diseases and keep yourself healthy. What should I know about diet, weight, and exercise? Eat a healthy diet   Eat a diet that includes plenty of vegetables, fruits, low-fat dairy products, and lean protein.  Do not eat a lot of foods that are high in solid fats, added sugars, or sodium. Maintain a healthy weight Body mass index (BMI) is used to identify weight problems. It estimates body fat based on height and weight. Your health care provider can help determine your BMI and help you achieve or maintain a healthy weight. Get regular exercise Get regular exercise. This is one of the most important things you can do for your health. Most adults should:  Exercise for at least 150 minutes each week. The exercise should increase your heart rate and make you sweat (moderate-intensity exercise).  Do strengthening exercises at least twice a week. This is in addition to the moderate-intensity exercise.  Spend less time sitting. Even light physical activity can be beneficial. Watch cholesterol and blood lipids Have your blood tested for lipids and cholesterol at 25 years of age, then have this test every 5 years. Have your cholesterol levels checked more often if:  Your lipid or cholesterol levels are high.  You are older than 25 years of age.  You are at high risk for heart disease. What should I know about cancer screening? Depending on your health history and family history, you may need to have cancer screening at various ages. This may include screening for:  Breast cancer.  Cervical cancer.  Colorectal cancer.  Skin cancer.  Lung cancer. What should I know about heart disease, diabetes, and high blood  pressure? Blood pressure and heart disease  High blood pressure causes heart disease and increases the risk of stroke. This is more likely to develop in people who have high blood pressure readings, are of African descent, or are overweight.  Have your blood pressure checked: ? Every 3-5 years if you are 18-39 years of age. ? Every year if you are 40 years old or older. Diabetes Have regular diabetes screenings. This checks your fasting blood sugar level. Have the screening done:  Once every three years after age 40 if you are at a normal weight and have a low risk for diabetes.  More often and at a younger age if you are overweight or have a high risk for diabetes. What should I know about preventing infection? Hepatitis B If you have a higher risk for hepatitis B, you should be screened for this virus. Talk with your health care provider to find out if you are at risk for hepatitis B infection. Hepatitis C Testing is recommended for:  Everyone born from 1945 through 1965.  Anyone with known risk factors for hepatitis C. Sexually transmitted infections (STIs)  Get screened for STIs, including gonorrhea and chlamydia, if: ? You are sexually active and are younger than 24 years of age. ? You are older than 24 years of age and your health care provider tells you that you are at risk for this type of infection. ? Your sexual activity has changed since you were last screened, and you are at increased risk for chlamydia or gonorrhea. Ask your health care provider if   you are at risk.  Ask your health care provider about whether you are at high risk for HIV. Your health care provider may recommend a prescription medicine to help prevent HIV infection. If you choose to take medicine to prevent HIV, you should first get tested for HIV. You should then be tested every 3 months for as long as you are taking the medicine. Pregnancy  If you are about to stop having your period (premenopausal) and  you may become pregnant, seek counseling before you get pregnant.  Take 400 to 800 micrograms (mcg) of folic acid every day if you become pregnant.  Ask for birth control (contraception) if you want to prevent pregnancy. Osteoporosis and menopause Osteoporosis is a disease in which the bones lose minerals and strength with aging. This can result in bone fractures. If you are 65 years old or older, or if you are at risk for osteoporosis and fractures, ask your health care provider if you should:  Be screened for bone loss.  Take a calcium or vitamin D supplement to lower your risk of fractures.  Be given hormone replacement therapy (HRT) to treat symptoms of menopause. Follow these instructions at home: Lifestyle  Do not use any products that contain nicotine or tobacco, such as cigarettes, e-cigarettes, and chewing tobacco. If you need help quitting, ask your health care provider.  Do not use street drugs.  Do not share needles.  Ask your health care provider for help if you need support or information about quitting drugs. Alcohol use  Do not drink alcohol if: ? Your health care provider tells you not to drink. ? You are pregnant, may be pregnant, or are planning to become pregnant.  If you drink alcohol: ? Limit how much you use to 0-1 drink a day. ? Limit intake if you are breastfeeding.  Be aware of how much alcohol is in your drink. In the [place] one drink equals one 12 oz bottle of beer (355 mL), one 5 oz glass of wine (148 mL), or one 1 oz glass of hard liquor (44 mL). General instructions  Schedule regular health, dental, and eye exams.  Stay current with your vaccines.  Tell your health care provider if: ? You often feel depressed. ? You have ever been abused or do not feel safe at home. Summary  Adopting a healthy lifestyle and getting preventive care are important in promoting health and wellness.  Follow your health care provider's instructions about healthy  diet, exercising, and getting tested or screened for diseases.  Follow your health care provider's instructions on monitoring your cholesterol and blood pressure. This information is not intended to replace advice given to you by your health care provider. Make sure you discuss any questions you have with your health care provider. Document Revised: 05/18/2018 Document Reviewed: 05/18/2018 Elsevier Patient Education  2020 [employer]  

## 2020-04-06 LAB — CBC WITH DIFFERENTIAL/PLATELET
Basophils Absolute: 0 10*3/uL (ref 0.0–0.2)
Basos: 1 %
EOS (ABSOLUTE): 0.1 10*3/uL (ref 0.0–0.4)
Eos: 1 %
Hematocrit: 37.6 % (ref 34.0–46.6)
Hemoglobin: 12.3 g/dL (ref 11.1–15.9)
Immature Grans (Abs): 0 10*3/uL (ref 0.0–0.1)
Immature Granulocytes: 0 %
Lymphocytes Absolute: 1.7 10*3/uL (ref 0.7–3.1)
Lymphs: 36 %
MCH: 30.3 pg (ref 26.6–33.0)
MCHC: 32.7 g/dL (ref 31.5–35.7)
MCV: 93 fL (ref 79–97)
Monocytes Absolute: 0.6 10*3/uL (ref 0.1–0.9)
Monocytes: 11 %
Neutrophils Absolute: 2.5 10*3/uL (ref 1.4–7.0)
Neutrophils: 51 %
Platelets: 260 10*3/uL (ref 150–450)
RBC: 4.06 x10E6/uL (ref 3.77–5.28)
RDW: 11.7 % (ref 11.7–15.4)
WBC: 4.9 10*3/uL (ref 3.4–10.8)

## 2020-04-06 LAB — TSH: TSH: 4.43 u[IU]/mL (ref 0.450–4.500)

## 2020-04-06 LAB — COMPREHENSIVE METABOLIC PANEL
ALT: 10 IU/L (ref 0–32)
AST: 19 IU/L (ref 0–40)
Albumin/Globulin Ratio: 1.4 (ref 1.2–2.2)
Albumin: 4.1 g/dL (ref 3.9–5.0)
Alkaline Phosphatase: 53 IU/L (ref 44–121)
BUN/Creatinine Ratio: 11 (ref 9–23)
BUN: 10 mg/dL (ref 6–20)
Bilirubin Total: 0.3 mg/dL (ref 0.0–1.2)
CO2: 21 mmol/L (ref 20–29)
Calcium: 8.6 mg/dL — ABNORMAL LOW (ref 8.7–10.2)
Chloride: 103 mmol/L (ref 96–106)
Creatinine, Ser: 0.91 mg/dL (ref 0.57–1.00)
GFR calc Af Amer: 101 mL/min/{1.73_m2} (ref >59)
GFR calc non Af Amer: 88 mL/min/{1.73_m2} (ref >59)
Globulin, Total: 2.9 g/dL (ref 1.5–4.5)
Glucose: 86 mg/dL (ref 65–99)
Potassium: 4.3 mmol/L (ref 3.5–5.2)
Sodium: 139 mmol/L (ref 134–144)
Total Protein: 7 g/dL (ref 6.0–8.5)

## 2020-04-06 LAB — LIPID PANEL
Chol/HDL Ratio: 3.2 ratio (ref 0.0–4.4)
Cholesterol, Total: 168 mg/dL (ref 100–199)
HDL: 52 mg/dL (ref >39)
LDL Chol Calc (NIH): 100 mg/dL — ABNORMAL HIGH (ref 0–99)
Triglycerides: 85 mg/dL (ref 0–149)
VLDL Cholesterol Cal: 16 mg/dL (ref 5–40)

## 2020-04-06 NOTE — Progress Notes (Signed)
Contacted via MyChart  Good morning Zoe Ramsey.  Overall labs continue to look great with exception of mild elevation ongoing in your LDL, the bad cholesterol number.  The LDL is the bad cholesterol. Over time and in combination with inflammation and other factors, this contributes to plaque which in turn may lead to stroke and/or heart attack down the road. Sometimes high LDL is primarily genetic, and people might be eating all the right foods but still have high numbers. Other times, there is room for improvement in one's diet and eating healthier can bring this number down and potentially reduce one's risk of heart attack and/or stroke.   To reduce your LDL, Remember - more fruits and vegetables, more fish, and limit red meat and dairy products. More soy, nuts, beans, barley, lentils, oats and plant sterol ester enriched margarine instead of butter. I also encourage eliminating sugar and processed food. Remember, shop on the outside of the grocery store and visit your [employer]. If you would like to talk with me about dietary changes for your cholesterol, please let me know. We should recheck your cholesterol in 12 months.  Any questions? Keep being awesome!!  Thank you for allowing me to participate in your care. Kindest regards, Kolyn Rozario

## 2021-04-07 ENCOUNTER — Encounter: Payer: 59 | Admitting: Nurse Practitioner

## 2021-10-26 DIAGNOSIS — E7800 Pure hypercholesterolemia, unspecified: Secondary | ICD-10-CM | POA: Insufficient documentation

## 2021-10-26 NOTE — Patient Instructions (Signed)

## 2021-10-31 ENCOUNTER — Encounter: Payer: Self-pay | Admitting: Nurse Practitioner

## 2021-10-31 ENCOUNTER — Ambulatory Visit (INDEPENDENT_AMBULATORY_CARE_PROVIDER_SITE_OTHER): Payer: 59 | Admitting: Nurse Practitioner

## 2021-10-31 VITALS — BP 102/69 | HR 62 | Temp 97.8°F | Ht 65.0 in | Wt 141.6 lb

## 2021-10-31 DIAGNOSIS — Z124 Encounter for screening for malignant neoplasm of cervix: Secondary | ICD-10-CM

## 2021-10-31 DIAGNOSIS — Z114 Encounter for screening for human immunodeficiency virus [HIV]: Secondary | ICD-10-CM | POA: Diagnosis not present

## 2021-10-31 DIAGNOSIS — Z1159 Encounter for screening for other viral diseases: Secondary | ICD-10-CM | POA: Diagnosis not present

## 2021-10-31 DIAGNOSIS — E7800 Pure hypercholesterolemia, unspecified: Secondary | ICD-10-CM

## 2021-10-31 DIAGNOSIS — Z0000 Encounter for general adult medical examination without abnormal findings: Secondary | ICD-10-CM

## 2021-10-31 NOTE — Progress Notes (Signed)
BP 102/69   Pulse 62   Temp 97.8 F (36.6 C) (Oral)   Ht  (1.651 m)   Wt 141 lb 9.6 oz (64.2 kg)   SpO2 98%   BMI 23.56 kg/m    Subjective:    Patient ID: Zoe Ramsey, female    DOB: Aug 24, 1995, 26 y.o.   MRN: [id number]  NOTE WRITTEN BY FNP STUDENT.  ASSESSMENT AND PLAN OF CARE REVIEWED WITH STUDENT, AGREE WITH ABOVE FINDINGS AND PLAN.   HPI: Zoe Ramsey is a 26 y.o. female presenting on 10/31/2021 for comprehensive medical examination. Current medical complaints include:none  She currently lives with: boyfriend  Depression Screen done today and results listed below:     10/31/2021    1:11 PM 04/05/2020    8:56 AM 03/31/2019    3:24 PM 02/10/2018    9:24 AM 01/06/2017    9:52 AM  Depression screen PHQ 2/9  Decreased Interest 0 0 0 0 0  Down, Depressed, Hopeless 0 0 0 0 0  PHQ - 2 Score 0 0 0 0 0  Altered sleeping 0   0   Tired, decreased energy 0   0   Change in appetite 0   0   Feeling bad or failure about yourself  0   0   Trouble concentrating 0   0   Moving slowly or fidgety/restless 0   0   Suicidal thoughts 0   0   PHQ-9 Score 0   0     The patient does not have a history of falls. I did not complete a risk assessment for falls. A plan of care for falls was not documented.   Past Medical History:  History reviewed. No pertinent past medical history.  Surgical History:  Past Surgical History:  Procedure Laterality Date   WISDOM TOOTH EXTRACTION      Medications:  Current Outpatient Medications on File Prior to Visit  Medication Sig   norgestimate-ethinyl estradiol (ORTHO-CYCLEN) 0.25-35 MG-MCG tablet Take 1 tablet by mouth daily.   No current facility-administered medications on file prior to visit.    Allergies:  No Known Allergies  Social History:  Social History   Socioeconomic History   Marital status: Single    Spouse name: Not on file   Number of children: Not on file   Years of education: Not on file   Highest  education level: Not on file  Occupational History   Not on file  Tobacco Use   Smoking status: Never   Smokeless tobacco: Never  Vaping Use   Vaping Use: Never used  Substance and Sexual Activity   Alcohol use: No    Alcohol/week: 0.0 standard drinks   Drug use: No   Sexual activity: Never  Other Topics Concern   Not on file  Social History Narrative   Not on file   Social Determinants of Health   Financial Resource Strain: Not on file  Food Insecurity: Not on file  Transportation Needs: Not on file  Physical Activity: Not on file  Stress: Not on file  Social Connections: Not on file  Intimate Partner Violence: Not on file   Social History   Tobacco Use  Smoking Status Never  Smokeless Tobacco Never   Social History   Substance and Sexual Activity  Alcohol Use No   Alcohol/week: 0.0 standard drinks    Family History:  Family History  Problem Relation Age of Onset   Hypertension Father  Hyperlipidemia Father    GER disease Father    Hypertension Brother    GER disease Brother    Heart disease Maternal Grandmother        CHF   Arthritis Maternal Grandmother    Heart disease Maternal Grandfather    Hypertension Maternal Grandfather    Parkinson's disease Paternal Grandmother    Dementia Paternal Grandfather    Healthy Brother     Past medical history, surgical history, medications, allergies, family history and social history reviewed with patient today and changes made to appropriate areas of the chart.    All other ROS negative except what is listed above and in the HPI.      Objective:    BP 102/69   Pulse 62   Temp 97.8 F (36.6 C) (Oral)   Ht 5\' 5"  (1.651 m)   Wt 141 lb 9.6 oz (64.2 kg)   SpO2 98%   BMI 23.56 kg/m   Wt Readings from Last 3 Encounters:  10/31/21 141 lb 9.6 oz (64.2 kg)  04/05/20 145 lb 12.8 oz (66.1 kg)  03/31/19 136 lb (61.7 kg)    Physical Exam Constitutional:      Appearance: Normal appearance. She is  well-groomed and normal weight. She is not ill-appearing.  HENT:     Head: Normocephalic.     Right Ear: Hearing and tympanic membrane normal.     Left Ear: Hearing and tympanic membrane normal.     Nose: Nose normal.     Mouth/Throat:     Pharynx: Oropharynx is clear. No posterior oropharyngeal erythema.  Eyes:     General: Lids are normal.     Conjunctiva/sclera: Conjunctivae normal.  Neck:     Thyroid: No thyromegaly.     Vascular: No carotid bruit or JVD.  Cardiovascular:     Rate and Rhythm: Normal rate and regular rhythm.     Pulses: Normal pulses.     Heart sounds: Normal heart sounds. No murmur heard.   No gallop.  Pulmonary:     Effort: Pulmonary effort is normal.     Breath sounds: Normal breath sounds. No stridor. No wheezing.  Abdominal:     General: Bowel sounds are normal.     Palpations: Abdomen is soft.     Tenderness: There is no abdominal tenderness.  Musculoskeletal:     Cervical back: Full passive range of motion without pain and normal range of motion.     Right lower leg: No edema.     Left lower leg: No edema.  Skin:    General: Skin is warm and dry.     Capillary Refill: Capillary refill takes less than 2 seconds.  Neurological:     General: No focal deficit present.     Mental Status: She is alert.     Deep Tendon Reflexes: Reflexes are normal and symmetric.  Psychiatric:        Attention and Perception: Attention normal.        Mood and Affect: Mood and affect normal.        Speech: Speech normal.        Behavior: Behavior normal. Behavior is cooperative.   Results for orders placed or performed in visit on 04/05/20  CBC with Differential  Result Value Ref Range   WBC 4.9 3.4 - 10.8 x10E3/uL   RBC 4.06 3.77 - 5.28 x10E6/uL   Hemoglobin 12.3 11.1 - 15.9 g/dL   Hematocrit 16.137.6 09.634.0 - 46.6 %   MCV 93  79 - 97 fL   MCH 30.3 26.6 - 33.0 pg   MCHC 32.7 31.5 - 35.7 g/dL   RDW 73.2 20.2 - 54.2 %   Platelets 260 150 - 450 x10E3/uL   Neutrophils  51 Not Estab. %   Lymphs 36 Not Estab. %   Monocytes 11 Not Estab. %   Eos 1 Not Estab. %   Basos 1 Not Estab. %   Neutrophils Absolute 2.5 1.4 - 7.0 x10E3/uL   Lymphocytes Absolute 1.7 0.7 - 3.1 x10E3/uL   Monocytes Absolute 0.6 0.1 - 0.9 x10E3/uL   EOS (ABSOLUTE) 0.1 0.0 - 0.4 x10E3/uL   Basophils Absolute 0.0 0.0 - 0.2 x10E3/uL   Immature Granulocytes 0 Not Estab. %   Immature Grans (Abs) 0.0 0.0 - 0.1 x10E3/uL  Comprehensive metabolic panel  Result Value Ref Range   Glucose 86 65 - 99 mg/dL   BUN 10 6 - 20 mg/dL   Creatinine, Ser 7.06 0.57 - 1.00 mg/dL   GFR calc non Af Amer 88 >59 mL/min/1.73   GFR calc Af Amer 101 >59 mL/min/1.73   BUN/Creatinine Ratio 11 9 - 23   Sodium 139 134 - 144 mmol/L   Potassium 4.3 3.5 - 5.2 mmol/L   Chloride 103 96 - 106 mmol/L   CO2 21 20 - 29 mmol/L   Calcium 8.6 (L) 8.7 - 10.2 mg/dL   Total Protein 7.0 6.0 - 8.5 g/dL   Albumin 4.1 3.9 - 5.0 g/dL   Globulin, Total 2.9 1.5 - 4.5 g/dL   Albumin/Globulin Ratio 1.4 1.2 - 2.2   Bilirubin Total 0.3 0.0 - 1.2 mg/dL   Alkaline Phosphatase 53 44 - 121 IU/L   AST 19 0 - 40 IU/L   ALT 10 0 - 32 IU/L  Lipid panel  Result Value Ref Range   Cholesterol, Total 168 100 - 199 mg/dL   Triglycerides 85 0 - 149 mg/dL   HDL 52 >23 mg/dL   VLDL Cholesterol Cal 16 5 - 40 mg/dL   LDL Chol Calc (NIH) 762 (H) 0 - 99 mg/dL   Chol/HDL Ratio 3.2 0.0 - 4.4 ratio  TSH  Result Value Ref Range   TSH 4.430 0.450 - 4.500 uIU/mL      Assessment & Plan:   Problem List Items Addressed This Visit       Other   Elevated low density lipoprotein (LDL) cholesterol level - Primary    LDL slightly elevated. 100 in 2021. Recheck lipid panel today. Educated patient on importance of healthy diet and regular exercise.        Relevant Orders   Comprehensive metabolic panel   Lipid Panel w/o Chol/HDL Ratio   Other Visit Diagnoses     Need for hepatitis C screening test       Hep C screening on labs today, discussed  with patient.   Relevant Orders   Hepatitis C antibody   Encounter for screening for HIV       HIV screening on labs today, discussed with patient.   Relevant Orders   HIV Antibody (routine testing w rflx)   Cervical cancer screening       GYN referral placed per patient request, on cycle today.   Relevant Orders   Ambulatory referral to Gynecology   Encounter for annual physical exam       Annual physical today with labs and health maintenance reviewed with patient.   Relevant Orders   CBC with Differential/Platelet   TSH  Follow up plan: Return in about 1 year (around 11/01/2022) for Annual physical.   LABORATORY TESTING:  - Pap smear:  defer by patient  IMMUNIZATIONS:   - Tdap: Tetanus vaccination status reviewed: last tetanus booster within 10 years. - Influenza: Up to date - Pneumovax: Not applicable - Prevnar: Given elsewhere - COVID: Up to date - HPV: Given elsewhere - Shingrix vaccine: Not applicable  SCREENING: -Mammogram: Not applicable  - Colonoscopy: Not applicable  - Bone Density: Not applicable  -Hearing Test: Not applicable  -Spirometry: Not applicable   PATIENT COUNSELING:   Advised to take 1 mg of folate supplement per day if capable of pregnancy.   Sexuality: Discussed sexually transmitted diseases, partner selection, use of condoms, avoidance of unintended pregnancy  and contraceptive alternatives.   Advised to avoid cigarette smoking.  I discussed with the patient that most people either abstain from alcohol or drink within safe limits (<=14/week and <=4 drinks/occasion for males, <=7/weeks and <= 3 drinks/occasion for females) and that the risk for alcohol disorders and other health effects rises proportionally with the number of drinks per week and how often a drinker exceeds daily limits.  Discussed cessation/primary prevention of drug use and availability of treatment for abuse.   Diet: Encouraged to adjust caloric intake to maintain   or achieve ideal body weight, to reduce intake of dietary saturated fat and total fat, to limit sodium intake by avoiding high sodium foods and not adding table salt, and to maintain adequate dietary potassium and calcium preferably from fresh fruits, vegetables, and low-fat dairy products.    Stressed the importance of regular exercise  Injury prevention: Discussed safety belts, safety helmets, smoke detector, smoking near bedding or upholstery.   Dental health: Discussed importance of regular tooth brushing, flossing, and dental visits.    NEXT PREVENTATIVE PHYSICAL DUE IN 1 YEAR. Return in about 1 year (around 11/01/2022) for Annual physical.

## 2021-10-31 NOTE — Progress Notes (Deleted)
There were no vitals taken for this visit.   Subjective:    Patient ID: Zoe Ramsey, female    DOB: 10/01/1995, 26 y.o.   MRN: [id number]  HPI: Zoe Ramsey is a 26 y.o. female presenting on 10/31/2021 for comprehensive medical examination. Current medical complaints include:{Blank single:19197::"none","***"}  She currently lives with: Menopausal Symptoms: {Blank single:19197::"yes","no"}  Depression Screen done today and results listed below:     04/05/2020    8:56 AM 03/31/2019    3:24 PM 02/10/2018    9:24 AM 01/06/2017    9:52 AM 12/26/2014   10:06 AM  Depression screen PHQ 2/9  Decreased Interest 0 0 0 0 0  Down, Depressed, Hopeless 0 0 0 0 0  PHQ - 2 Score 0 0 0 0 0  Altered sleeping   0    Tired, decreased energy   0    Change in appetite   0    Feeling bad or failure about yourself    0    Trouble concentrating   0    Moving slowly or fidgety/restless   0    Suicidal thoughts   0    PHQ-9 Score   0      The patient does not have a history of falls. I did not complete a risk assessment for falls. A plan of care for falls was not documented.   Past Medical History:  No past medical history on file.  Surgical History:  Past Surgical History:  Procedure Laterality Date   WISDOM TOOTH EXTRACTION      Medications:  No current outpatient medications on file prior to visit.   No current facility-administered medications on file prior to visit.    Allergies:  No Known Allergies  Social History:  Social History   Socioeconomic History   Marital status: Single    Spouse name: Not on file   Number of children: Not on file   Years of education: Not on file   Highest education level: Not on file  Occupational History   Not on file  Tobacco Use   Smoking status: Never   Smokeless tobacco: Never  Vaping Use   Vaping Use: Never used  Substance and Sexual Activity   Alcohol use: No    Alcohol/week: 0.0 standard drinks   Drug use: No   Sexual  activity: Never  Other Topics Concern   Not on file  Social History Narrative   Not on file   Social Determinants of Health   Financial Resource Strain: Not on file  Food Insecurity: Not on file  Transportation Needs: Not on file  Physical Activity: Not on file  Stress: Not on file  Social Connections: Not on file  Intimate Partner Violence: Not on file   Social History   Tobacco Use  Smoking Status Never  Smokeless Tobacco Never   Social History   Substance and Sexual Activity  Alcohol Use No   Alcohol/week: 0.0 standard drinks    Family History:  Family History  Problem Relation Age of Onset   Hypertension Father    Hyperlipidemia Father    GER disease Father    Hypertension Brother    GER disease Brother    Heart disease Maternal Grandmother        CHF   Arthritis Maternal Grandmother    Heart disease Maternal Grandfather    Hypertension Maternal Grandfather    Parkinson's disease Paternal Grandmother    Dementia Paternal Grandfather  Healthy Brother     Past medical history, surgical history, medications, allergies, family history and social history reviewed with patient today and changes made to appropriate areas of the chart.   ROS All other ROS negative except what is listed above and in the HPI.      Objective:    There were no vitals taken for this visit.  Wt Readings from Last 3 Encounters:  04/05/20 145 lb 12.8 oz (66.1 kg)  03/31/19 136 lb (61.7 kg)  01/25/19 140 lb (63.5 kg)    Physical Exam  Results for orders placed or performed in visit on 04/05/20  CBC with Differential  Result Value Ref Range   WBC 4.9 3.4 - 10.8 x10E3/uL   RBC 4.06 3.77 - 5.28 x10E6/uL   Hemoglobin 12.3 11.1 - 15.9 g/dL   Hematocrit 79.0 24.0 - 46.6 %   MCV 93 79 - 97 fL   MCH 30.3 26.6 - 33.0 pg   MCHC 32.7 31.5 - 35.7 g/dL   RDW 97.3 53.2 - 99.2 %   Platelets 260 150 - 450 x10E3/uL   Neutrophils 51 Not Estab. %   Lymphs 36 Not Estab. %   Monocytes 11  Not Estab. %   Eos 1 Not Estab. %   Basos 1 Not Estab. %   Neutrophils Absolute 2.5 1.4 - 7.0 x10E3/uL   Lymphocytes Absolute 1.7 0.7 - 3.1 x10E3/uL   Monocytes Absolute 0.6 0.1 - 0.9 x10E3/uL   EOS (ABSOLUTE) 0.1 0.0 - 0.4 x10E3/uL   Basophils Absolute 0.0 0.0 - 0.2 x10E3/uL   Immature Granulocytes 0 Not Estab. %   Immature Grans (Abs) 0.0 0.0 - 0.1 x10E3/uL  Comprehensive metabolic panel  Result Value Ref Range   Glucose 86 65 - 99 mg/dL   BUN 10 6 - 20 mg/dL   Creatinine, Ser 4.26 0.57 - 1.00 mg/dL   GFR calc non Af Amer 88 >59 mL/min/1.73   GFR calc Af Amer 101 >59 mL/min/1.73   BUN/Creatinine Ratio 11 9 - 23   Sodium 139 134 - 144 mmol/L   Potassium 4.3 3.5 - 5.2 mmol/L   Chloride 103 96 - 106 mmol/L   CO2 21 20 - 29 mmol/L   Calcium 8.6 (L) 8.7 - 10.2 mg/dL   Total Protein 7.0 6.0 - 8.5 g/dL   Albumin 4.1 3.9 - 5.0 g/dL   Globulin, Total 2.9 1.5 - 4.5 g/dL   Albumin/Globulin Ratio 1.4 1.2 - 2.2   Bilirubin Total 0.3 0.0 - 1.2 mg/dL   Alkaline Phosphatase 53 44 - 121 IU/L   AST 19 0 - 40 IU/L   ALT 10 0 - 32 IU/L  Lipid panel  Result Value Ref Range   Cholesterol, Total 168 100 - 199 mg/dL   Triglycerides 85 0 - 149 mg/dL   HDL 52 >83 mg/dL   VLDL Cholesterol Cal 16 5 - 40 mg/dL   LDL Chol Calc (NIH) 419 (H) 0 - 99 mg/dL   Chol/HDL Ratio 3.2 0.0 - 4.4 ratio  TSH  Result Value Ref Range   TSH 4.430 0.450 - 4.500 uIU/mL      Assessment & Plan:   Problem List Items Addressed This Visit       Other   Elevated low density lipoprotein (LDL) cholesterol level - Primary   Relevant Orders   Comprehensive metabolic panel   Lipid Panel w/o Chol/HDL Ratio   Other Visit Diagnoses     Need for hepatitis C screening test  Hep C screening on labs today, discussed with patient.   Relevant Orders   Hepatitis C antibody   Encounter for screening for HIV       HIV screening on labs today, discussed with patient.   Relevant Orders   HIV Antibody (routine testing w  rflx)   Cervical cancer screening       Pap smear obtained and sent to lab today.   Relevant Orders   Cytology - PAP   Encounter for annual physical exam       Annual physical today with labs and health maintenance reviewed with patient.   Relevant Orders   CBC with Differential/Platelet   TSH        Follow up plan: No follow-ups on file.   LABORATORY TESTING:  - Pap smear: {Blank single:19197::"pap done","not applicable","up to date","done elsewhere"}  IMMUNIZATIONS:   - Tdap: Tetanus vaccination status reviewed: last tetanus booster within 10 years. - Influenza: {Blank single:19197::"Up to date","Administered today","Postponed to flu season","Refused","Given elsewhere"} - Pneumovax: {Blank single:19197::"Up to date","Administered today","Not applicable","Refused","Given elsewhere"} - Prevnar: {Blank single:19197::"Up to date","Administered today","Not applicable","Refused","Given elsewhere"} - COVID: {Blank single:19197::"Up to date","Administered today","Not applicable","Refused","Given elsewhere"} - HPV: {Blank single:19197::"Up to date","Administered today","Not applicable","Refused","Given elsewhere"} - Shingrix vaccine: {Blank single:19197::"Up to date","Administered today","Not applicable","Refused","Given elsewhere"}  SCREENING: -Mammogram: {Blank single:19197::"Up to date","Ordered today","Not applicable","Refused","Done elsewhere"}  - Colonoscopy: {Blank single:19197::"Up to date","Ordered today","Not applicable","Refused","Done elsewhere"}  - Bone Density: {Blank single:19197::"Up to date","Ordered today","Not applicable","Refused","Done elsewhere"}  -Hearing Test: {Blank single:19197::"Up to date","Ordered today","Not applicable","Refused","Done elsewhere"}  -Spirometry: {Blank single:19197::"Up to date","Ordered today","Not applicable","Refused","Done elsewhere"}   PATIENT COUNSELING:   Advised to take 1 mg of folate supplement per day if capable of pregnancy.    Sexuality: Discussed sexually transmitted diseases, partner selection, use of condoms, avoidance of unintended pregnancy  and contraceptive alternatives.   Advised to avoid cigarette smoking.  I discussed with the patient that most people either abstain from alcohol or drink within safe limits (<=14/week and <=4 drinks/occasion for males, <=7/weeks and <= 3 drinks/occasion for females) and that the risk for alcohol disorders and other health effects rises proportionally with the number of drinks per week and how often a drinker exceeds daily limits.  Discussed cessation/primary prevention of drug use and availability of treatment for abuse.   Diet: Encouraged to adjust caloric intake to maintain  or achieve ideal body weight, to reduce intake of dietary saturated fat and total fat, to limit sodium intake by avoiding high sodium foods and not adding table salt, and to maintain adequate dietary potassium and calcium preferably from fresh fruits, vegetables, and low-fat dairy products.    Stressed the importance of regular exercise  Injury prevention: Discussed safety belts, safety helmets, smoke detector, smoking near bedding or upholstery.   Dental health: Discussed importance of regular tooth brushing, flossing, and dental visits.    NEXT PREVENTATIVE PHYSICAL DUE IN 1 YEAR. No follow-ups on file.

## 2021-10-31 NOTE — Assessment & Plan Note (Addendum)
LDL slightly elevated. 100 in 2021. Recheck lipid panel today. Educated patient on importance of healthy diet and regular exercise.

## 2021-11-01 LAB — CBC WITH DIFFERENTIAL/PLATELET
Basophils Absolute: 0 10*3/uL (ref 0.0–0.2)
Basos: 0 %
EOS (ABSOLUTE): 0.1 10*3/uL (ref 0.0–0.4)
Eos: 2 %
Hematocrit: 37.9 % (ref 34.0–46.6)
Hemoglobin: 12.7 g/dL (ref 11.1–15.9)
Immature Grans (Abs): 0 10*3/uL (ref 0.0–0.1)
Immature Granulocytes: 0 %
Lymphocytes Absolute: 1.5 10*3/uL (ref 0.7–3.1)
Lymphs: 25 %
MCH: 31.5 pg (ref 26.6–33.0)
MCHC: 33.5 g/dL (ref 31.5–35.7)
MCV: 94 fL (ref 79–97)
Monocytes Absolute: 0.6 10*3/uL (ref 0.1–0.9)
Monocytes: 9 %
Neutrophils Absolute: 3.8 10*3/uL (ref 1.4–7.0)
Neutrophils: 64 %
Platelets: 265 10*3/uL (ref 150–450)
RBC: 4.03 x10E6/uL (ref 3.77–5.28)
RDW: 11.8 % (ref 11.7–15.4)
WBC: 6 10*3/uL (ref 3.4–10.8)

## 2021-11-01 LAB — COMPREHENSIVE METABOLIC PANEL
ALT: 11 IU/L (ref 0–32)
AST: 16 IU/L (ref 0–40)
Albumin/Globulin Ratio: 1.4 (ref 1.2–2.2)
Albumin: 4 g/dL (ref 3.9–5.0)
Alkaline Phosphatase: 58 IU/L (ref 44–121)
BUN/Creatinine Ratio: 11 (ref 9–23)
BUN: 10 mg/dL (ref 6–20)
Bilirubin Total: 0.3 mg/dL (ref 0.0–1.2)
CO2: 24 mmol/L (ref 20–29)
Calcium: 8.9 mg/dL (ref 8.7–10.2)
Chloride: 104 mmol/L (ref 96–106)
Creatinine, Ser: 0.87 mg/dL (ref 0.57–1.00)
Globulin, Total: 2.9 g/dL (ref 1.5–4.5)
Glucose: 88 mg/dL (ref 70–99)
Potassium: 4.3 mmol/L (ref 3.5–5.2)
Sodium: 140 mmol/L (ref 134–144)
Total Protein: 6.9 g/dL (ref 6.0–8.5)
eGFR: 94 mL/min/{1.73_m2} (ref >59)

## 2021-11-01 LAB — LIPID PANEL W/O CHOL/HDL RATIO
Cholesterol, Total: 176 mg/dL (ref 100–199)
HDL: 57 mg/dL (ref >39)
LDL Chol Calc (NIH): 106 mg/dL — ABNORMAL HIGH (ref 0–99)
Triglycerides: 69 mg/dL (ref 0–149)
VLDL Cholesterol Cal: 13 mg/dL (ref 5–40)

## 2021-11-01 LAB — HEPATITIS C ANTIBODY: Hep C Virus Ab: NONREACTIVE

## 2021-11-01 LAB — HIV ANTIBODY (ROUTINE TESTING W REFLEX): HIV Screen 4th Generation wRfx: NONREACTIVE

## 2021-11-01 LAB — TSH: TSH: 3.56 u[IU]/mL (ref 0.450–4.500)

## 2021-11-02 NOTE — Progress Notes (Signed)
Contacted via MyChart   Good evening Zoe Ramsey, your labs have returned: - Hep C and HIV are negative - CBC, CMP, and TSH show stable levels. - Cholesterol labs continue to show mild elevation in LDL -- continue your focus on diet and regular exercise.  Any questions? Keep being amazing!!  Thank you for allowing me to participate in your care.  I appreciate you. Kindest regards, Fenton Candee

## 2021-12-30 ENCOUNTER — Encounter: Payer: Self-pay | Admitting: Obstetrics and Gynecology

## 2021-12-30 ENCOUNTER — Other Ambulatory Visit (HOSPITAL_COMMUNITY)
Admission: RE | Admit: 2021-12-30 | Discharge: 2021-12-30 | Disposition: A | Discharge status: Home / Self Care | Payer: 59 | Source: Ambulatory Visit | Attending: Obstetrics and Gynecology | Admitting: Obstetrics and Gynecology

## 2021-12-30 ENCOUNTER — Ambulatory Visit: Payer: 59 | Admitting: Obstetrics and Gynecology

## 2021-12-30 DIAGNOSIS — Z124 Encounter for screening for malignant neoplasm of cervix: Secondary | ICD-10-CM | POA: Diagnosis not present

## 2021-12-30 DIAGNOSIS — Z3009 Encounter for other general counseling and advice on contraception: Secondary | ICD-10-CM | POA: Diagnosis not present

## 2021-12-30 DIAGNOSIS — Z01419 Encounter for gynecological examination (general) (routine) without abnormal findings: Secondary | ICD-10-CM

## 2021-12-30 NOTE — Progress Notes (Signed)
HPI:      [name] is a 26 y.o. G0P0000 who LMP was No LMP recorded. (Menstrual status: Oral contraceptives).  Subjective:   She presents today for her annual examination.  She is currently taking combination OCPs and having normal regular cycles.  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 She has no complaints.    Hx: The following portions of the patient's history were reviewed and updated as appropriate:             She  has no past medical history on file. She does not have any pertinent problems on file. She  has a past surgical history that includes Wisdom tooth extraction. Her family history includes Arthritis in her maternal grandmother; Dementia in her paternal grandfather; GER disease in her brother and father; Healthy in her brother; Heart disease in her maternal grandfather and maternal grandmother; Hyperlipidemia in her father; Hypertension in her brother, father, and maternal grandfather; Parkinson's disease in her paternal grandmother. She  reports that she has never smoked. She has never used smokeless tobacco. She reports that she does not drink alcohol and does not use drugs. She has a current medication list which includes the following prescription(s): norgestimate-ethinyl estradiol. She has No Known Allergies.       Review of Systems:  Review of Systems  Constitutional: Denied constitutional symptoms, night sweats, recent illness, fatigue, fever, insomnia and weight loss.  Eyes: Denied eye symptoms, eye pain, photophobia, vision change and visual disturbance.  Ears/Nose/Throat/Neck: Denied ear, nose, throat or neck symptoms, hearing loss, nasal discharge, sinus congestion and sore throat.  Cardiovascular: Denied cardiovascular symptoms, arrhythmia, chest pain/pressure, edema, exercise intolerance, orthopnea and palpitations.   Respiratory: Denied pulmonary symptoms, asthma, pleuritic pain, productive sputum, cough, dyspnea and wheezing.  Gastrointestinal: Denied, gastro-esophageal reflux, melena, nausea and vomiting.  Genitourinary: Denied genitourinary symptoms including symptomatic vaginal discharge, pelvic relaxation issues, and urinary complaints.  Musculoskeletal: Denied musculoskeletal symptoms, stiffness, swelling, muscle weakness and myalgia.  Dermatologic: Denied dermatology symptoms, rash and scar.  Neurologic: Denied neurology symptoms, dizziness, headache, neck pain and syncope.  Psychiatric: Denied psychiatric symptoms, anxiety and depression.  Endocrine: Denied endocrine symptoms including hot flashes and night sweats.   Meds:   Current Outpatient Medications on File Prior to Visit  Medication Sig Dispense Refill   norgestimate-ethinyl estradiol (ORTHO-CYCLEN) 0.25-35 MG-MCG tablet Take 1 tablet by mouth daily.     No current facility-administered medications on file prior to visit.     Objective:     There were no vitals filed for this visit.  There were no vitals filed for this visit.            Physical examination General NAD, Conversant  HEENT Atraumatic; Op clear with mmm.  Normo-cephalic. Pupils reactive. Anicteric sclerae  Thyroid/Neck Smooth without nodularity or enlargement. Normal ROM.  Neck Supple.  Skin No rashes, lesions or ulceration. Normal palpated skin turgor. No nodularity.  Breasts: No masses or discharge.  Symmetric.  No axillary adenopathy.  Lungs: Clear to auscultation.No rales or wheezes. Normal Respiratory effort, no retractions.  Heart: NSR.  No murmurs or rubs appreciated. No periferal edema  Abdomen: Soft.  Non-tender.  No masses.  No HSM. No hernia  Extremities: Moves all appropriately.  Normal ROM for age. No lymphadenopathy.  Neuro: Oriented to PPT.  Normal mood. Normal affect.  Pelvic:   Vulva: Normal appearance.  No lesions.  Vagina: No lesions or  abnormalities noted.  Support: Normal pelvic support.  Urethra No masses tenderness or scarring.  Meatus Normal size without lesions or prolapse.  Cervix: Normal appearance.  No lesions.  Anus: Normal exam.  No lesions.  Perineum: Normal exam.  No lesions.        Bimanual   Uterus: Normal size.  Non-tender.  Mobile.  AV.  Adnexae: No masses.  Non-tender to palpation.  Cul-de-sac: Negative for abnormality.     Assessment:    G0P0000 Patient Active Problem List   Diagnosis Date Noted   Elevated low density lipoprotein (LDL) cholesterol level 10/26/2021     1. Well woman exam with routine gynecological exam   2. Cervical cancer screening   3. Birth control counseling        Plan:            1.  Basic Screening Recommendations The basic screening recommendations for asymptomatic women were discussed with the patient during her visit.  The age-appropriate recommendations were discussed with her and the rational for the tests reviewed.  When I am informed by the patient that another primary care physician has previously obtained the age-appropriate tests and they are up-to-date, only outstanding tests are ordered and referrals given as necessary.  Abnormal results of tests will be discussed with her when all of her results are completed.  Routine preventative health maintenance measures emphasized: Exercise/Diet/Weight control, Tobacco Warnings, Alcohol/Substance use risks and Stress Management  Pap performed  2.  IUD discussed in detail.  Multiple birth control methods discussed.  Risk benefits reviewed.  Once she has decided upon a birth control method she will inform [place].  Because she is nulliparous I have suggested IUD placement with menses if IUD is what she desires.  Orders No orders of the defined types were placed in this encounter.   No orders of the defined types were placed in this encounter.         F/U  No follow-ups on file.  Elonda Husky, M.D. 12/30/2021 10:31  AM

## 2021-12-30 NOTE — Progress Notes (Signed)
Patients presents for annual exam today. She states regular cycles with daily OCP's but would like to discuss IUD options. Patient is due for pap smear, ordered. Patient states no other questions or concerns at this time.

## 2022-01-02 LAB — CYTOLOGY - PAP
Comment: NEGATIVE
Diagnosis: UNDETERMINED — AB
High risk HPV: NEGATIVE

## 2022-01-13 ENCOUNTER — Encounter: Payer: 59 | Admitting: Obstetrics and Gynecology

## 2022-01-20 ENCOUNTER — Encounter: Payer: Self-pay | Admitting: Obstetrics and Gynecology

## 2022-01-20 ENCOUNTER — Ambulatory Visit: Payer: 59 | Admitting: Obstetrics and Gynecology

## 2022-01-20 VITALS — BP 130/86 | HR 73 | Ht 65.0 in | Wt 149.9 lb

## 2022-01-20 DIAGNOSIS — Z30430 Encounter for insertion of intrauterine contraceptive device: Secondary | ICD-10-CM | POA: Diagnosis not present

## 2022-01-20 MED ORDER — LEVONORGESTREL 20 MCG/DAY IU IUD: 1.0000 | INTRAUTERINE_SYSTEM | Freq: Once | INTRAUTERINE | Status: AC

## 2022-01-20 NOTE — Progress Notes (Signed)
HPI:      [name] is a 26 y.o. G0P0000 who LMP was No LMP recorded (lmp unknown). (Menstrual status: Oral contraceptives).  Subjective:   She presents today for IUD insertion.  She has chosen Mirena IUD for birth control.    Hx: The following portions of the patient's history were reviewed and updated as appropriate:             She  has no past medical history on file. She does not have any pertinent problems on file. She  has a past surgical history that includes Wisdom tooth extraction. Her family history includes Arthritis in her maternal grandmother; Dementia in her paternal grandfather; GER disease in her brother and father; Healthy in her brother; Heart disease in her maternal grandfather and maternal grandmother; Hyperlipidemia in her father; Hypertension in her brother, father, and maternal grandfather; Parkinson's disease in her paternal grandmother. She  reports that she has never smoked. She has never used smokeless tobacco. She reports that she does not drink alcohol and does not use drugs. She has a current medication list which includes the following prescription(s): norgestimate-ethinyl estradiol. She has No Known Allergies.       Review of Systems:  Review of Systems  Constitutional: Denied constitutional symptoms, night sweats, recent illness, fatigue, fever, insomnia and weight loss.  Eyes: Denied eye symptoms, eye pain, photophobia, vision change and visual disturbance.  Ears/Nose/Throat/Neck: Denied ear, nose, throat or neck symptoms, hearing loss, nasal discharge, sinus congestion and sore throat.  Cardiovascular: Denied cardiovascular symptoms, arrhythmia, chest pain/pressure, edema, exercise intolerance, orthopnea and palpitations.  Respiratory: Denied pulmonary symptoms, asthma, pleuritic pain, productive sputum, cough, dyspnea and wheezing.  Gastrointestinal: Denied, gastro-esophageal reflux, melena, nausea and vomiting.  Genitourinary: Denied  genitourinary symptoms including symptomatic vaginal discharge, pelvic relaxation issues, and urinary complaints.  Musculoskeletal: Denied musculoskeletal symptoms, stiffness, swelling, muscle weakness and myalgia.  Dermatologic: Denied dermatology symptoms, rash and scar.  Neurologic: Denied neurology symptoms, dizziness, headache, neck pain and syncope.  Psychiatric: Denied psychiatric symptoms, anxiety and depression.  Endocrine: Denied endocrine symptoms including hot flashes and night sweats.   Meds:   Current Outpatient Medications on File Prior to Visit  Medication Sig Dispense Refill   norgestimate-ethinyl estradiol (ORTHO-CYCLEN) 0.25-35 MG-MCG tablet Take 1 tablet by mouth daily.     No current facility-administered medications on file prior to visit.    Objective:     Vitals:   01/20/22 1530  BP: 130/86  Pulse: 73    Physical examination   Pelvic:   Vulva: Normal appearance.  No lesions.  Vagina: No lesions or abnormalities noted.  Support: Normal pelvic support.  Urethra No masses tenderness or scarring.  Meatus Normal size without lesions or prolapse.  Cervix: Normal appearance.  No lesions.  Anus: Normal exam.  No lesions.  Perineum: Normal exam.  No lesions.        Bimanual   Uterus: Normal size.  Non-tender.  Mobile.  AV.  Adnexae: No masses.  Non-tender to palpation.  Cul-de-sac: Negative for abnormality.   IUD Procedure Pt has read the booklet and signed the appropriate forms regarding the Mirena IUD.  All of her questions have been answered.   The cervix was cleansed with betadine solution.  After sounding the uterus and noting the position, the IUD was placed in the usual manner without problem.  The string was cut to the appropriate length.  The patient tolerated the procedure well.   NDC # = [id number]  Assessment:    G0P0000 Patient Active Problem List   Diagnosis Date Noted   Elevated low density lipoprotein (LDL) cholesterol  level 10/26/2021     1. Encounter for IUD insertion       Plan:             F/U  Return in about 4 weeks (around 02/17/2022) for For IUD f/u.  Elonda Husky, M.D. 01/20/2022 3:59 PM

## 2022-01-20 NOTE — Progress Notes (Signed)
Patient presents today for IUD insertion. She states she is nervous but has no questions or concerns at this time.

## 2022-01-21 ENCOUNTER — Encounter: Payer: Self-pay | Admitting: Obstetrics and Gynecology

## 2022-02-24 ENCOUNTER — Encounter: Payer: Self-pay | Admitting: Obstetrics and Gynecology

## 2022-02-24 ENCOUNTER — Ambulatory Visit: Payer: 59 | Admitting: Obstetrics and Gynecology

## 2022-02-24 VITALS — BP 111/77 | HR 75 | Ht 65.0 in | Wt 149.2 lb

## 2022-02-24 DIAGNOSIS — Z30431 Encounter for routine checking of intrauterine contraceptive device: Secondary | ICD-10-CM

## 2022-02-24 NOTE — Progress Notes (Signed)
Patient presents today for IUD string check. She states lightly bleeding . Patient reports no pain, discomfort or trouble with intercourse. No other questions or concerns.

## 2022-02-24 NOTE — Progress Notes (Signed)
HPI:      Zoe Ramsey is a 26 y.o. G0P0000 who LMP was No LMP recorded. (Menstrual status: IUD).  Subjective:   She presents today for IUD follow-up.  She reports no problems.  She has had intercourse without issue.  She states that she continues to have some on again off again spotting.  She is having no pelvic pain or discomfort.    Hx: The following portions of the patient's history were reviewed and updated as appropriate:             She  has no past medical history on file. She does not have any pertinent problems on file. She  has a past surgical history that includes Wisdom tooth extraction. Her family history includes Arthritis in her maternal grandmother; Dementia in her paternal grandfather; GER disease in her brother and father; Healthy in her brother; Heart disease in her maternal grandfather and maternal grandmother; Hyperlipidemia in her father; Hypertension in her brother, father, and maternal grandfather; Parkinson's disease in her paternal grandmother. She  reports that she has never smoked. She has never used smokeless tobacco. She reports that she does not drink alcohol and does not use drugs. She has a current medication list which includes the following Facility-Administered Medications: levonorgestrel. She has No Known Allergies.       Review of Systems:  Review of Systems  Constitutional: Denied constitutional symptoms, night sweats, recent illness, fatigue, fever, insomnia and weight loss.  Eyes: Denied eye symptoms, eye pain, photophobia, vision change and visual disturbance.  Ears/Nose/Throat/Neck: Denied ear, nose, throat or neck symptoms, hearing loss, nasal discharge, sinus congestion and sore throat.  Cardiovascular: Denied cardiovascular symptoms, arrhythmia, chest pain/pressure, edema, exercise intolerance, orthopnea and palpitations.  Respiratory: Denied pulmonary symptoms, asthma, pleuritic pain, productive sputum, cough, dyspnea and wheezing.   Gastrointestinal: Denied, gastro-esophageal reflux, melena, nausea and vomiting.  Genitourinary: Denied genitourinary symptoms including symptomatic vaginal discharge, pelvic relaxation issues, and urinary complaints.  Musculoskeletal: Denied musculoskeletal symptoms, stiffness, swelling, muscle weakness and myalgia.  Dermatologic: Denied dermatology symptoms, rash and scar.  Neurologic: Denied neurology symptoms, dizziness, headache, neck pain and syncope.  Psychiatric: Denied psychiatric symptoms, anxiety and depression.  Endocrine: Denied endocrine symptoms including hot flashes and night sweats.   Meds:   No current outpatient medications on file prior to visit.   Current Facility-Administered Medications on File Prior to Visit  Medication Dose Route Frequency Provider Last Rate Last Admin   levonorgestrel (MIRENA) 20 MCG/DAY IUD 1 each  1 each Intrauterine Once Zoe Collin, MD          Objective:     Vitals:   02/24/22 1507  BP: 111/77  Pulse: 75   Filed Weights   02/24/22 1507  Weight: 149 lb 3.2 oz (67.7 kg)              Physical examination   Pelvic:   Vulva: Normal appearance.  No lesions.  Vagina: No lesions or abnormalities noted.  Support: Normal pelvic support.  Urethra No masses tenderness or scarring.  Meatus Normal size without lesions or prolapse.  Cervix: Normal appearance.  No lesions. IUD strings noted at cervical os.  Anus: Normal exam.  No lesions.  Perineum: Normal exam.  No lesions.        Bimanual   Uterus: Normal size.  Non-tender.  Mobile.  AV.  Adnexae: No masses.  Non-tender to palpation.  Cul-de-sac: Negative for abnormality.  Assessment:    G0P0000 Patient Active Problem List   Diagnosis Date Noted   Elevated low density lipoprotein (LDL) cholesterol level 10/26/2021     1. Encounter for routine checking of intrauterine contraceptive device (IUD)        Plan:            1.  Expect spotting to resolve  in the next 1 to 2 weeks.  2.  Patient will follow-up for annual examination. Orders No orders of the defined types were placed in this encounter.   No orders of the defined types were placed in this encounter.     F/U  Return for Annual Physical. I spent 12 minutes involved in the care of this patient preparing to see the patient by obtaining and reviewing her medical history (including labs, imaging tests and prior procedures), documenting clinical information in the electronic health record (EHR), counseling and coordinating care plans, writing and sending prescriptions, ordering tests or procedures and in direct communicating with the patient and medical staff discussing pertinent items from her history and physical exam.  Finis Bud, M.D. 02/24/2022 3:56 PM

## 2022-06-07 ENCOUNTER — Telehealth: Payer: Self-pay | Admitting: Urgent Care

## 2022-06-07 DIAGNOSIS — R6889 Other general symptoms and signs: Secondary | ICD-10-CM

## 2022-06-07 NOTE — Progress Notes (Signed)
E visit for Flu like symptoms   We are sorry that you are not feeling well.  Here is how we plan to help! Based on what you have shared with me it looks like you may have flu-like symptoms that should be watched but do not seem to indicate anti-viral treatment.  Influenza or "the flu" is   an infection caused by a respiratory virus. The flu virus is highly contagious and persons who did not receive their yearly flu vaccination may "catch" the flu from close contact.  Please use salt water gargles and ibuprofen or tylenol for your fever and sore throat.  ANYONE WHO HAS FLU SYMPTOMS SHOULD: Stay home. The flu is highly contagious and going out or to work exposes others! Be sure to drink plenty of fluids. Water is fine as well as fruit juices, sodas and electrolyte beverages. You may want to stay away from caffeine or alcohol. If you are nauseated, try taking small sips of liquids. How do you know if you are getting enough fluid? Your urine should be a pale yellow or almost colorless. Get rest. Taking a steamy shower or using a humidifier may help nasal congestion and ease sore throat pain. Using a saline nasal spray works much the same way. Cough drops, hard candies and sore throat lozenges may ease your cough. Line up a caregiver. Have someone check on you regularly.   GET HELP RIGHT AWAY IF: You cannot keep down liquids or your medications. You become short of breath Your fell like you are going to pass out or loose consciousness. Your symptoms persist after you have completed your treatment plan MAKE SURE YOU  Understand these instructions. Will watch your condition. Will get help right away if you are not doing well or get worse.  Your e-visit answers were reviewed by a board certified advanced clinical practitioner to complete your personal care plan.  Depending on the condition, your plan could have included both over the counter or prescription medications.  If there is a problem  please reply  once you have received a response from your provider.  Your safety is important to [place].  If you have drug allergies check your prescription carefully.    You can use MyChart to ask questions about today's visit, request a non-urgent call back, or ask for a work or school excuse for 24 hours related to this e-Visit. If it has been greater than 24 hours you will need to follow up with your provider, or enter a new e-Visit to address those concerns.  You will get an e-mail in the next two days asking about your experience.  I hope that your e-visit has been valuable and will speed your recovery. Thank you for using e-visits.   I have spent 5 minutes in review of e-visit questionnaire, review and updating patient chart, medical decision making and response to patient.   Kaylie Ritter L Latonyia Lopata, PA

## 2022-11-03 ENCOUNTER — Encounter: Payer: Self-pay | Admitting: Nurse Practitioner

## 2022-12-02 ENCOUNTER — Encounter: Payer: BC Managed Care – PPO | Admitting: Family Medicine

## 2022-12-02 DIAGNOSIS — E7800 Pure hypercholesterolemia, unspecified: Secondary | ICD-10-CM

## 2022-12-02 DIAGNOSIS — Z0000 Encounter for general adult medical examination without abnormal findings: Secondary | ICD-10-CM

## 2022-12-23 ENCOUNTER — Encounter: Payer: BC Managed Care – PPO | Admitting: Nurse Practitioner

## 2022-12-23 DIAGNOSIS — Z0000 Encounter for general adult medical examination without abnormal findings: Secondary | ICD-10-CM

## 2022-12-23 DIAGNOSIS — E7800 Pure hypercholesterolemia, unspecified: Secondary | ICD-10-CM

## 2023-01-16 NOTE — Patient Instructions (Signed)
Wegovy or Zepbound  Focus on DASH diet for high blood pressure or Mediterranean diet  Be Involved in Caring For Your Health:  Taking Medications When medications are taken as directed, they can greatly improve your health. But if they are not taken as prescribed, they may not work. In some cases, not taking them correctly can be harmful. To help ensure your treatment remains effective and safe, understand your medications and how to take them. Bring your medications to each visit for review by your provider.  Your lab results, notes, and after visit summary will be available on My Chart. We strongly encourage you to use this feature. If lab results are abnormal the clinic will contact you with the appropriate steps. If the clinic does not contact you assume the results are satisfactory. You can always view your results on My Chart. If you have questions regarding your health or results, please contact the clinic during office hours. You can also ask questions on My Chart.  We at [facility] are grateful that you chose us to provide your care. We strive to provide evidence-based and compassionate care and are always looking for feedback. If you get a survey from the clinic please complete this so we can hear your opinions.  Preventing High Cholesterol Cholesterol is a white, waxy substance similar to fat that the human body needs to help build cells. The liver makes all the cholesterol that a person's body needs. Having high cholesterol (hypercholesterolemia) increases your risk for heart disease and stroke. Extra or excess cholesterol comes from the food that you eat. High cholesterol can often be prevented with diet and lifestyle changes. If you already have high cholesterol, you can control it with diet, lifestyle changes, and medicines. How can high cholesterol affect me? If you have high cholesterol, fatty deposits (plaques) may build up on the walls of your blood vessels. The blood  vessels that carry blood away from your heart are called arteries. Plaques make the arteries narrower and stiffer. This in turn can: Restrict or block blood flow and cause blood clots to form. Increase your risk for heart attack and stroke. What can increase my risk for high cholesterol? This condition is more likely to develop in people who: Eat foods that are high in saturated fat or cholesterol. Saturated fat is mostly found in foods that come from animal sources. Are overweight. Are not getting enough exercise. Use products that contain nicotine or tobacco, such as cigarettes, e-cigarettes, and chewing tobacco. Have a family history of high cholesterol (familial hypercholesterolemia). What actions can I take to prevent this? Nutrition  Eat less saturated fat. Avoid trans fats (partially hydrogenated oils). These are often found in margarine and in some baked goods, fried foods, and snacks bought in packages. Avoid precooked or cured meat, such as bacon, sausages, or meat loaves. Avoid foods and drinks that have added sugars. Eat more fruits, vegetables, and whole grains. Choose healthy sources of protein, such as fish, poultry, lean cuts of red meat, beans, peas, lentils, and nuts. Choose healthy sources of fat, such as: Nuts. Vegetable oils, especially olive oil. Fish that have healthy fats, such as omega-3 fatty acids. These fish include mackerel or salmon. Lifestyle Lose weight if you are overweight. Maintaining a healthy body mass index (BMI) can help prevent or control high cholesterol. It can also lower your risk for diabetes and high blood pressure. Ask your health care provider to help you with a diet and exercise plan to lose   weight safely. Do not use any products that contain nicotine or tobacco. These products include cigarettes, chewing tobacco, and vaping devices, such as e-cigarettes. If you need help quitting, ask your health care provider. Alcohol use Do not drink  alcohol if: Your health care provider tells you not to drink. You are pregnant, may be pregnant, or are planning to become pregnant. If you drink alcohol: Limit how much you have to: 0-1 drink a day for women. 0-2 drinks a day for men. Know how much alcohol is in your drink. In the [place] one drink equals one 12 oz bottle of beer (355 mL), one 5 oz glass of wine (148 mL), or one 1 oz glass of hard liquor (44 mL). Activity  Get enough exercise. Do exercises as told by your health care provider. Each week, do at least 150 minutes of exercise that takes a medium level of effort (moderate-intensity exercise). This kind of exercise: Makes your heart beat faster while allowing you to still be able to talk. Can be done in short sessions several times a day or longer sessions a few times a week. For example, on 5 days each week, you could walk fast or ride your bike 3 times a day for 10 minutes each time. Medicines Your health care provider may recommend medicines to help lower cholesterol. This may be a medicine to lower the amount of cholesterol that your liver makes. You may need medicine if: Diet and lifestyle changes have not lowered your cholesterol enough. You have high cholesterol and other risk factors for heart disease or stroke. Take over-the-counter and prescription medicines only as told by your health care provider. General information Manage your risk factors for high cholesterol. Talk with your health care provider about all your risk factors and how to lower your risk. Manage other conditions that you have, such as diabetes or high blood pressure (hypertension). Have blood tests to check your cholesterol levels at regular points in time as told by your health care provider. Keep all follow-up visits. This is important. Where to find more information American Heart Association: www.heart.org National Heart, Lung, and Blood Institute: www.nhlbi.nih.gov Summary High cholesterol  increases your risk for heart disease and stroke. By keeping your cholesterol level low, you can reduce your risk for these conditions. High cholesterol can often be prevented with diet and lifestyle changes. Work with your health care provider to manage your risk factors, and have your blood tested regularly. This information is not intended to replace advice given to you by your health care provider. Make sure you discuss any questions you have with your health care provider. Document Revised: 12/26/2021 Document Reviewed: 07/29/2020 Elsevier Patient Education  2024 Elsevier Inc.  

## 2023-01-20 ENCOUNTER — Ambulatory Visit (INDEPENDENT_AMBULATORY_CARE_PROVIDER_SITE_OTHER): Payer: BC Managed Care – PPO | Admitting: Nurse Practitioner

## 2023-01-20 ENCOUNTER — Encounter: Payer: Self-pay | Admitting: Nurse Practitioner

## 2023-01-20 VITALS — BP 105/72 | HR 64 | Temp 97.7°F | Ht 65.8 in | Wt 146.0 lb

## 2023-01-20 DIAGNOSIS — Z0000 Encounter for general adult medical examination without abnormal findings: Secondary | ICD-10-CM | POA: Diagnosis not present

## 2023-01-20 DIAGNOSIS — E7800 Pure hypercholesterolemia, unspecified: Secondary | ICD-10-CM | POA: Diagnosis not present

## 2023-01-20 DIAGNOSIS — Z975 Presence of (intrauterine) contraceptive device: Secondary | ICD-10-CM | POA: Insufficient documentation

## 2023-01-20 NOTE — Assessment & Plan Note (Signed)
Noted on past labs, she is working on diet and regular exercise.  Recheck today.

## 2023-01-20 NOTE — Progress Notes (Signed)
BP 105/72   Pulse 64   Temp 97.7 F (36.5 C) (Oral)   Ht 5' 5.8" (1.671 m)   Wt 146 lb (66.2 kg)   SpO2 100%   BMI 23.71 kg/m    Subjective:    Patient ID: Zoe Ramsey, female    DOB: 1995/07/07, 27 y.o.   MRN: [id number]  HPI: Zoe Ramsey is a 27 y.o. female presenting on 01/20/2023 for comprehensive medical examination. Current medical complaints include:none  She currently lives with: boyfriend  Depression Screen done today and results listed below:     01/20/2023    9:40 AM 10/31/2021    1:11 PM 04/05/2020    8:56 AM 03/31/2019    3:24 PM 02/10/2018    9:24 AM  Depression screen PHQ 2/9  Decreased Interest 0 0 0 0 0  Down, Depressed, Hopeless 0 0 0 0 0  PHQ - 2 Score 0 0 0 0 0  Altered sleeping 0 0   0  Tired, decreased energy 0 0   0  Change in appetite 0 0   0  Feeling bad or failure about yourself  0 0   0  Trouble concentrating 0 0   0  Moving slowly or fidgety/restless 0 0   0  Suicidal thoughts 0 0   0  PHQ-9 Score 0 0   0  Difficult doing work/chores Not difficult at all          01/20/2023    9:41 AM 10/31/2021    1:12 PM  GAD 7 : Generalized Anxiety Score  Nervous, Anxious, on Edge 0 0  Control/stop worrying 0 0  Worry too much - different things 0 0  Trouble relaxing 0 0  Restless 0 0  Easily annoyed or irritable 0 0  Afraid - awful might happen 0 0  Total GAD 7 Score 0 0  Anxiety Difficulty Not difficult at all        01/06/2017    9:52 AM 02/10/2018    9:24 AM 03/31/2019    3:24 PM 10/31/2021    1:11 PM 01/20/2023    9:39 AM  Fall Risk  Falls in the past year? No No 0 0 0  Was there an injury with Fall?   0 0 0  Fall Risk Category Calculator   0 0 0  Fall Risk Category (Retired)   Low Low   (RETIRED) Patient Fall Risk Level   Low fall risk Low fall risk   Patient at Risk for Falls Due to    No Fall Risks No Fall Risks  Fall risk Follow up   Falls evaluation completed Falls evaluation completed Falls evaluation completed      Past Medical History:  History reviewed. No pertinent past medical history.  Surgical History:  Past Surgical History:  Procedure Laterality Date   WISDOM TOOTH EXTRACTION      Medications:  No current outpatient medications on file prior to visit.   Current Facility-Administered Medications on File Prior to Visit  Medication   levonorgestrel (MIRENA) 20 MCG/DAY IUD 1 each    Allergies:  No Known Allergies  Social History:  Social History   Socioeconomic History   Marital status: Single    Spouse name: Not on file   Number of children: Not on file   Years of education: Not on file   Highest education level: Bachelor's degree (e.g., BA, AB, BS)  Occupational History   Not on file  Tobacco  Use   Smoking status: Never   Smokeless tobacco: Never  Vaping Use   Vaping status: Never Used  Substance and Sexual Activity   Alcohol use: No   Drug use: No   Sexual activity: Yes    Birth control/protection: I.U.D., None  Other Topics Concern   Not on file  Social History Narrative   Not on file   Social Determinants of Health   Financial Resource Strain: Low Risk  (01/19/2023)   Overall Financial Resource Strain (CARDIA)    Difficulty of Paying Living Expenses: Not hard at all  Food Insecurity: No Food Insecurity (01/19/2023)   Hunger Vital Sign    Worried About Running Out of Food in the Last Year: Never true    Ran Out of Food in the Last Year: Never true  Transportation Needs: No Transportation Needs (01/19/2023)   PRAPARE - Administrator, Civil Service (Medical): No    Lack of Transportation (Non-Medical): No  Physical Activity: Sufficiently Active (01/19/2023)   Exercise Vital Sign    Days of Exercise per Week: 3 days    Minutes of Exercise per Session: 60 min  Stress: No Stress Concern Present (01/19/2023)   [employer] of Occupational Health - Occupational Stress Questionnaire    Feeling of Stress : Only a little  Social Connections:  Moderately Integrated (01/19/2023)   Social Connection and Isolation Panel [NHANES]    Frequency of Communication with Friends and Family: More than three times a week    Frequency of Social Gatherings with Friends and Family: More than three times a week    Attends Religious Services: Never    Database administrator or Organizations: Yes    Attends Engineer, structural: More than 4 times per year    Marital Status: Living with partner  Intimate Partner Violence: Not on file   Social History   Tobacco Use  Smoking Status Never  Smokeless Tobacco Never   Social History   Substance and Sexual Activity  Alcohol Use No    Family History:  Family History  Problem Relation Age of Onset   Hypertension Father    Hyperlipidemia Father    GER disease Father    Hypertension Brother    GER disease Brother    Heart disease Maternal Grandmother        CHF   Arthritis Maternal Grandmother    Heart disease Maternal Grandfather    Hypertension Maternal Grandfather    Parkinson's disease Paternal Grandmother    Dementia Paternal Grandfather    Healthy Brother    ADD / ADHD Brother     Past medical history, surgical history, medications, allergies, family history and social history reviewed with patient today and changes made to appropriate areas of the chart.    All other ROS negative except what is listed above and in the HPI.      Objective:    BP 105/72   Pulse 64   Temp 97.7 F (36.5 C) (Oral)   Ht 5' 5.8" (1.671 m)   Wt 146 lb (66.2 kg)   SpO2 100%   BMI 23.71 kg/m   Wt Readings from Last 3 Encounters:  01/20/23 146 lb (66.2 kg)  02/24/22 149 lb 3.2 oz (67.7 kg)  01/20/22 149 lb 14.4 oz (68 kg)    Physical Exam Constitutional:      Appearance: Normal appearance. She is well-groomed and normal weight. She is not ill-appearing.  HENT:     Head:  Normocephalic.     Right Ear: Hearing and tympanic membrane normal.     Left Ear: Hearing and tympanic membrane  normal.     Nose: Nose normal.     Mouth/Throat:     Pharynx: Oropharynx is clear. No posterior oropharyngeal erythema.  Eyes:     General: Lids are normal.     Conjunctiva/sclera: Conjunctivae normal.  Neck:     Thyroid: No thyromegaly.     Vascular: No carotid bruit or JVD.  Cardiovascular:     Rate and Rhythm: Normal rate and regular rhythm.     Pulses: Normal pulses.     Heart sounds: Normal heart sounds. No murmur heard.    No gallop.  Pulmonary:     Effort: Pulmonary effort is normal.     Breath sounds: Normal breath sounds. No stridor. No wheezing.  Abdominal:     General: Bowel sounds are normal.     Palpations: Abdomen is soft.     Tenderness: There is no abdominal tenderness.  Musculoskeletal:     Cervical back: Full passive range of motion without pain and normal range of motion.     Right lower leg: No edema.     Left lower leg: No edema.  Skin:    General: Skin is warm and dry.     Capillary Refill: Capillary refill takes less than 2 seconds.  Neurological:     General: No focal deficit present.     Mental Status: She is alert.     Deep Tendon Reflexes: Reflexes are normal and symmetric.  Psychiatric:        Attention and Perception: Attention normal.        Mood and Affect: Mood and affect normal.        Speech: Speech normal.        Behavior: Behavior normal. Behavior is cooperative.    Results for orders placed or performed in visit on 12/30/21  Cytology - PAP  Result Value Ref Range   High risk HPV Negative    Adequacy      Satisfactory for evaluation; transformation zone component PRESENT.   Diagnosis (A)     - Atypical squamous cells of undetermined significance (ASC-US)   Comment Normal Reference Range HPV - Negative       Assessment & Plan:   Problem List Items Addressed This Visit       Other   Elevated low density lipoprotein (LDL) cholesterol level - Primary    Noted on past labs, she is working on diet and regular exercise.  Recheck  today.      Relevant Orders   Comprehensive metabolic panel   Lipid Panel w/o Chol/HDL Ratio   Other Visit Diagnoses     Encounter for annual physical exam       Annual physical today with labs and health maintenance reviewed, discussed with patient.   Relevant Orders   CBC with Differential/Platelet   TSH        Follow up plan: Return in about 1 year (around 01/20/2024) for Annual Exam.   LABORATORY TESTING:  - Pap smear: Up To Date  IMMUNIZATIONS:   - Tdap: Tetanus vaccination status reviewed: last tetanus booster within 10 years. - Influenza: Up to date - Pneumovax: Not applicable - Prevnar: Up To Date - COVID: Up to date - HPV: Up To Date - Shingrix vaccine: Not applicable  SCREENING: -Mammogram: Not applicable  - Colonoscopy: Not applicable  - Bone Density: Not applicable  -Hearing  Test: Not applicable  -Spirometry: Not applicable   PATIENT COUNSELING:   Advised to take 1 mg of folate supplement per day if capable of pregnancy.   Sexuality: Discussed sexually transmitted diseases, partner selection, use of condoms, avoidance of unintended pregnancy  and contraceptive alternatives.   Advised to avoid cigarette smoking.  I discussed with the patient that most people either abstain from alcohol or drink within safe limits (<=14/week and <=4 drinks/occasion for males, <=7/weeks and <= 3 drinks/occasion for females) and that the risk for alcohol disorders and other health effects rises proportionally with the number of drinks per week and how often a drinker exceeds daily limits.  Discussed cessation/primary prevention of drug use and availability of treatment for abuse.   Diet: Encouraged to adjust caloric intake to maintain  or achieve ideal body weight, to reduce intake of dietary saturated fat and total fat, to limit sodium intake by avoiding high sodium foods and not adding table salt, and to maintain adequate dietary potassium and calcium preferably from fresh  fruits, vegetables, and low-fat dairy products.    Stressed the importance of regular exercise  Injury prevention: Discussed safety belts, safety helmets, smoke detector, smoking near bedding or upholstery.   Dental health: Discussed importance of regular tooth brushing, flossing, and dental visits.    NEXT PREVENTATIVE PHYSICAL DUE IN 1 YEAR. Return in about 1 year (around 01/20/2024) for Annual Exam.

## 2023-01-21 LAB — COMPREHENSIVE METABOLIC PANEL
ALT: 16 IU/L (ref 0–32)
AST: 23 IU/L (ref 0–40)
Albumin: 4.4 g/dL (ref 4.0–5.0)
Alkaline Phosphatase: 61 IU/L (ref 44–121)
BUN/Creatinine Ratio: 11 (ref 9–23)
BUN: 11 mg/dL (ref 6–20)
Bilirubin Total: 0.3 mg/dL (ref 0.0–1.2)
CO2: 25 mmol/L (ref 20–29)
Calcium: 9.4 mg/dL (ref 8.7–10.2)
Chloride: 101 mmol/L (ref 96–106)
Creatinine, Ser: 0.98 mg/dL (ref 0.57–1.00)
Globulin, Total: 3.1 g/dL (ref 1.5–4.5)
Glucose: 89 mg/dL (ref 70–99)
Potassium: 5 mmol/L (ref 3.5–5.2)
Sodium: 136 mmol/L (ref 134–144)
Total Protein: 7.5 g/dL (ref 6.0–8.5)
eGFR: 81 mL/min/{1.73_m2} (ref >59)

## 2023-01-21 LAB — LIPID PANEL W/O CHOL/HDL RATIO
Cholesterol, Total: 163 mg/dL (ref 100–199)
HDL: 61 mg/dL (ref >39)
LDL Chol Calc (NIH): 90 mg/dL (ref 0–99)
Triglycerides: 58 mg/dL (ref 0–149)
VLDL Cholesterol Cal: 12 mg/dL (ref 5–40)

## 2023-01-21 LAB — CBC WITH DIFFERENTIAL/PLATELET
Basophils Absolute: 0 10*3/uL (ref 0.0–0.2)
Basos: 1 %
EOS (ABSOLUTE): 0.1 10*3/uL (ref 0.0–0.4)
Eos: 3 %
Hematocrit: 43 % (ref 34.0–46.6)
Hemoglobin: 13.9 g/dL (ref 11.1–15.9)
Immature Grans (Abs): 0 10*3/uL (ref 0.0–0.1)
Immature Granulocytes: 0 %
Lymphocytes Absolute: 1.3 10*3/uL (ref 0.7–3.1)
Lymphs: 26 %
MCH: 30.9 pg (ref 26.6–33.0)
MCHC: 32.3 g/dL (ref 31.5–35.7)
MCV: 96 fL (ref 79–97)
Monocytes Absolute: 0.6 10*3/uL (ref 0.1–0.9)
Monocytes: 11 %
Neutrophils Absolute: 3 10*3/uL (ref 1.4–7.0)
Neutrophils: 59 %
Platelets: 245 10*3/uL (ref 150–450)
RBC: 4.5 x10E6/uL (ref 3.77–5.28)
RDW: 11.9 % (ref 11.7–15.4)
WBC: 5 10*3/uL (ref 3.4–10.8)

## 2023-01-21 LAB — TSH: TSH: 4.07 u[IU]/mL (ref 0.450–4.500)

## 2023-01-21 NOTE — Progress Notes (Signed)
Contacted via MyChart   Good afternoon Zoe Ramsey, your labs have returned.  Everything looks great and your cholesterol labs are better!!  Great job!!  Keep up the good work on your health. Keep being amazing!!  Thank you for allowing me to participate in your care.  I appreciate you. Kindest regards, Maximum Reiland

## 2024-01-21 ENCOUNTER — Encounter: Payer: Self-pay | Admitting: Nurse Practitioner

## 2024-03-11 NOTE — Patient Instructions (Incomplete)
 Be Involved in Caring For Your Health:  Taking Medications When medications are taken as directed, they can greatly improve your health. But if they are not taken as prescribed, they may not work. In some cases, not taking them correctly can be harmful. To help ensure your treatment remains effective and safe, understand your medications and how to take them. Bring your medications to each visit for review by your provider.  Your lab results, notes, and after visit summary will be available on My Chart. We strongly encourage you to use this feature. If lab results are abnormal the clinic will contact you with the appropriate steps. If the clinic does not contact you assume the results are satisfactory. You can always view your results on My Chart. If you have questions regarding your health or results, please contact the clinic during office hours. You can also ask questions on My Chart.  We at [facility] are grateful that you chose us  to provide your care. We strive to provide evidence-based and compassionate care and are always looking for feedback. If you get a survey from the clinic please complete this so we can hear your opinions.  Healthy Eating, Adult Healthy eating may help you get and keep a healthy body weight, reduce the risk of chronic disease, and live a long and productive life. It is important to follow a healthy eating pattern. Your nutritional and calorie needs should be met mainly by different nutrient-rich foods. What are tips for following this plan? Reading food labels Read labels and choose the following: Reduced or low sodium products. Juices with 100% fruit juice. Foods with low saturated fats (<3 g per serving) and high polyunsaturated and monounsaturated fats. Foods with whole grains, such as whole wheat, cracked wheat, brown rice, and wild rice. Whole grains that are fortified with folic acid. This is recommended for females who are pregnant or who want to  become pregnant. Read labels and do not eat or drink the following: Foods or drinks with added sugars. These include foods that contain brown sugar, corn sweetener, corn syrup, dextrose , fructose, glucose, high-fructose corn syrup, honey, invert sugar, lactose, malt syrup, maltose, molasses, raw sugar, sucrose, trehalose, or turbinado sugar. Limit your intake of added sugars to less than 10% of your total daily calories. Do not eat more than the following amounts of added sugar per day: 6 teaspoons (25 g) for females. 9 teaspoons (38 g) for males. Foods that contain processed or refined starches and grains. Refined grain products, such as white flour, degermed cornmeal, white bread, and white rice. Shopping Choose nutrient-rich snacks, such as vegetables, whole fruits, and nuts. Avoid high-calorie and high-sugar snacks, such as potato chips, fruit snacks, and candy. Use oil-based dressings and spreads on foods instead of solid fats such as butter, margarine, sour cream, or cream cheese. Limit pre-made sauces, mixes, and instant products such as flavored rice, instant noodles, and ready-made pasta. Try more plant-protein sources, such as tofu, tempeh, black beans, edamame, lentils, nuts, and seeds. Explore eating plans such as the Mediterranean diet or vegetarian diet. Try heart-healthy dips made with beans and healthy fats like hummus and guacamole. Vegetables go great with these. Cooking Use oil to saut or stir-fry foods instead of solid fats such as butter, margarine, or lard. Try baking, boiling, grilling, or broiling instead of frying. Remove the fatty part of meats before cooking. Steam vegetables in water  or broth. Meal planning  At meals, imagine dividing your plate into fourths: One-half of  your plate is fruits and vegetables. One-fourth of your plate is whole grains. One-fourth of your plate is protein, especially lean meats, poultry, eggs, tofu, beans, or nuts. Include low-fat  dairy as part of your daily diet. Lifestyle Choose healthy options in all settings, including home, work, school, restaurants, or stores. Prepare your food safely: Wash your hands after handling raw meats. Where you prepare food, keep surfaces clean by regularly washing with hot, soapy water . Keep raw meats separate from ready-to-eat foods, such as fruits and vegetables. Cook seafood, meat, poultry, and eggs to the recommended temperature. Get a food thermometer. Store foods at safe temperatures. In general: Keep cold foods at 84F (4.4C) or below. Keep hot foods at 184F (60C) or above. Keep your freezer at [facility] (-17.8C) or below. Foods are not safe to eat if they have been between the temperatures of 40-184F (4.4-60C) for more than 2 hours. What foods should I eat? Fruits Aim to eat 1-2 cups of fresh, canned (in natural juice), or frozen fruits each day. One cup of fruit equals 1 small apple, 1 large banana, 8 large strawberries, 1 cup (237 g) canned fruit,  cup (82 g) dried fruit, or 1 cup (240 mL) 100% juice. Vegetables Aim to eat 2-4 cups of fresh and frozen vegetables each day, including different varieties and colors. One cup of vegetables equals 1 cup (91 g) broccoli or cauliflower florets, 2 medium carrots, 2 cups (150 g) raw, leafy greens, 1 large tomato, 1 large bell pepper, 1 large sweet potato, or 1 medium white potato. Grains Aim to eat 5-10 ounce-equivalents of whole grains each day. Examples of 1 ounce-equivalent of grains include 1 slice of bread, 1 cup (40 g) ready-to-eat cereal, 3 cups (24 g) popcorn, or  cup (93 g) cooked rice. Meats and other proteins Try to eat 5-7 ounce-equivalents of protein each day. Examples of 1 ounce-equivalent of protein include 1 egg,  oz nuts (12 almonds, 24 pistachios, or 7 walnut halves), 1/4 cup (90 g) cooked beans, 6 tablespoons (90 g) hummus or 1 tablespoon (16 g) peanut butter. A cut of meat or fish that is the size of a deck of  cards is about 3-4 ounce-equivalents (85 g). Of the protein you eat each week, try to have at least 8 sounce (227 g) of seafood. This is about 2 servings per week. This includes salmon, trout, herring, sardines, and anchovies. Dairy Aim to eat 3 cup-equivalents of fat-free or low-fat dairy each day. Examples of 1 cup-equivalent of dairy include 1 cup (240 mL) milk, 8 ounces (250 g) yogurt, 1 ounces (44 g) natural cheese, or 1 cup (240 mL) fortified soy milk. Fats and oils Aim for about 5 teaspoons (21 g) of fats and oils per day. Choose monounsaturated fats, such as canola and olive oils, mayonnaise made with olive oil or avocado oil, avocados, peanut butter, and most nuts, or polyunsaturated fats, such as sunflower, corn, and soybean oils, walnuts, pine nuts, sesame seeds, sunflower seeds, and flaxseed. Beverages Aim for 6 eight-ounce glasses of water  per day. Limit coffee to 3-5 eight-ounce cups per day. Limit caffeinated beverages that have added calories, such as soda and energy drinks. If you drink alcohol: Limit how much you have to: 0-1 drink a day if you are female. 0-2 drinks a day if you are female. Know how much alcohol is in your drink. In the [place] one drink is one 12 oz bottle of beer (355 mL), one 5 oz glass of wine (  148 mL), or one 1 oz glass of hard liquor (44 mL). Seasoning and other foods Try not to add too much salt to your food. Try using herbs and spices instead of salt. Try not to add sugar to food. This information is based on [place] nutrition guidelines. To learn more, visit [employer]. Exact amounts may vary. You may need different amounts. This information is not intended to replace advice given to you by your health care provider. Make sure you discuss any questions you have with your health care provider. Document Revised: 02/23/2022 Document Reviewed: 02/23/2022 Elsevier Patient Education  2024 [employer].

## 2024-03-14 ENCOUNTER — Encounter: Payer: Self-pay | Admitting: Nurse Practitioner

## 2024-03-14 DIAGNOSIS — Z975 Presence of (intrauterine) contraceptive device: Secondary | ICD-10-CM

## 2024-03-14 DIAGNOSIS — Z0000 Encounter for general adult medical examination without abnormal findings: Secondary | ICD-10-CM

## 2024-03-14 DIAGNOSIS — E7800 Pure hypercholesterolemia, unspecified: Secondary | ICD-10-CM

## 2024-03-29 ENCOUNTER — Encounter: Payer: Self-pay | Admitting: Nurse Practitioner

## 2024-03-30 MED ORDER — VALACYCLOVIR HCL 1 G PO TABS: 1000.0000 mg | ORAL_TABLET | Freq: Two times a day (BID) | ORAL | 0 refills | Status: AC

## 2024-04-16 NOTE — Patient Instructions (Signed)
 Be Involved in Caring For Your Health:  Taking Medications When medications are taken as directed, they can greatly improve your health. But if they are not taken as prescribed, they may not work. In some cases, not taking them correctly can be harmful. To help ensure your treatment remains effective and safe, understand your medications and how to take them. Bring your medications to each visit for review by your provider.  Your lab results, notes, and after visit summary will be available on My Chart. We strongly encourage you to use this feature. If lab results are abnormal the clinic will contact you with the appropriate steps. If the clinic does not contact you assume the results are satisfactory. You can always view your results on My Chart. If you have questions regarding your health or results, please contact the clinic during office hours. You can also ask questions on My Chart.  We at [facility] are grateful that you chose us  to provide your care. We strive to provide evidence-based and compassionate care and are always looking for feedback. If you get a survey from the clinic please complete this so we can hear your opinions.  Healthy Eating, Adult Healthy eating may help you get and keep a healthy body weight, reduce the risk of chronic disease, and live a long and productive life. It is important to follow a healthy eating pattern. Your nutritional and calorie needs should be met mainly by different nutrient-rich foods. What are tips for following this plan? Reading food labels Read labels and choose the following: Reduced or low sodium products. Juices with 100% fruit juice. Foods with low saturated fats (<3 g per serving) and high polyunsaturated and monounsaturated fats. Foods with whole grains, such as whole wheat, cracked wheat, brown rice, and wild rice. Whole grains that are fortified with folic acid. This is recommended for females who are pregnant or who want to  become pregnant. Read labels and do not eat or drink the following: Foods or drinks with added sugars. These include foods that contain brown sugar, corn sweetener, corn syrup, dextrose , fructose, glucose, high-fructose corn syrup, honey, invert sugar, lactose, malt syrup, maltose, molasses, raw sugar, sucrose, trehalose, or turbinado sugar. Limit your intake of added sugars to less than 10% of your total daily calories. Do not eat more than the following amounts of added sugar per day: 6 teaspoons (25 g) for females. 9 teaspoons (38 g) for males. Foods that contain processed or refined starches and grains. Refined grain products, such as white flour, degermed cornmeal, white bread, and white rice. Shopping Choose nutrient-rich snacks, such as vegetables, whole fruits, and nuts. Avoid high-calorie and high-sugar snacks, such as potato chips, fruit snacks, and candy. Use oil-based dressings and spreads on foods instead of solid fats such as butter, margarine, sour cream, or cream cheese. Limit pre-made sauces, mixes, and instant products such as flavored rice, instant noodles, and ready-made pasta. Try more plant-protein sources, such as tofu, tempeh, black beans, edamame, lentils, nuts, and seeds. Explore eating plans such as the Mediterranean diet or vegetarian diet. Try heart-healthy dips made with beans and healthy fats like hummus and guacamole. Vegetables go great with these. Cooking Use oil to saut or stir-fry foods instead of solid fats such as butter, margarine, or lard. Try baking, boiling, grilling, or broiling instead of frying. Remove the fatty part of meats before cooking. Steam vegetables in water  or broth. Meal planning  At meals, imagine dividing your plate into fourths: One-half of  your plate is fruits and vegetables. One-fourth of your plate is whole grains. One-fourth of your plate is protein, especially lean meats, poultry, eggs, tofu, beans, or nuts. Include low-fat  dairy as part of your daily diet. Lifestyle Choose healthy options in all settings, including home, work, school, restaurants, or stores. Prepare your food safely: Wash your hands after handling raw meats. Where you prepare food, keep surfaces clean by regularly washing with hot, soapy water . Keep raw meats separate from ready-to-eat foods, such as fruits and vegetables. Cook seafood, meat, poultry, and eggs to the recommended temperature. Get a food thermometer. Store foods at safe temperatures. In general: Keep cold foods at 84F (4.4C) or below. Keep hot foods at 184F (60C) or above. Keep your freezer at [facility] (-17.8C) or below. Foods are not safe to eat if they have been between the temperatures of 40-184F (4.4-60C) for more than 2 hours. What foods should I eat? Fruits Aim to eat 1-2 cups of fresh, canned (in natural juice), or frozen fruits each day. One cup of fruit equals 1 small apple, 1 large banana, 8 large strawberries, 1 cup (237 g) canned fruit,  cup (82 g) dried fruit, or 1 cup (240 mL) 100% juice. Vegetables Aim to eat 2-4 cups of fresh and frozen vegetables each day, including different varieties and colors. One cup of vegetables equals 1 cup (91 g) broccoli or cauliflower florets, 2 medium carrots, 2 cups (150 g) raw, leafy greens, 1 large tomato, 1 large bell pepper, 1 large sweet potato, or 1 medium white potato. Grains Aim to eat 5-10 ounce-equivalents of whole grains each day. Examples of 1 ounce-equivalent of grains include 1 slice of bread, 1 cup (40 g) ready-to-eat cereal, 3 cups (24 g) popcorn, or  cup (93 g) cooked rice. Meats and other proteins Try to eat 5-7 ounce-equivalents of protein each day. Examples of 1 ounce-equivalent of protein include 1 egg,  oz nuts (12 almonds, 24 pistachios, or 7 walnut halves), 1/4 cup (90 g) cooked beans, 6 tablespoons (90 g) hummus or 1 tablespoon (16 g) peanut butter. A cut of meat or fish that is the size of a deck of  cards is about 3-4 ounce-equivalents (85 g). Of the protein you eat each week, try to have at least 8 sounce (227 g) of seafood. This is about 2 servings per week. This includes salmon, trout, herring, sardines, and anchovies. Dairy Aim to eat 3 cup-equivalents of fat-free or low-fat dairy each day. Examples of 1 cup-equivalent of dairy include 1 cup (240 mL) milk, 8 ounces (250 g) yogurt, 1 ounces (44 g) natural cheese, or 1 cup (240 mL) fortified soy milk. Fats and oils Aim for about 5 teaspoons (21 g) of fats and oils per day. Choose monounsaturated fats, such as canola and olive oils, mayonnaise made with olive oil or avocado oil, avocados, peanut butter, and most nuts, or polyunsaturated fats, such as sunflower, corn, and soybean oils, walnuts, pine nuts, sesame seeds, sunflower seeds, and flaxseed. Beverages Aim for 6 eight-ounce glasses of water  per day. Limit coffee to 3-5 eight-ounce cups per day. Limit caffeinated beverages that have added calories, such as soda and energy drinks. If you drink alcohol: Limit how much you have to: 0-1 drink a day if you are female. 0-2 drinks a day if you are female. Know how much alcohol is in your drink. In the [place] one drink is one 12 oz bottle of beer (355 mL), one 5 oz glass of wine (  148 mL), or one 1 oz glass of hard liquor (44 mL). Seasoning and other foods Try not to add too much salt to your food. Try using herbs and spices instead of salt. Try not to add sugar to food. This information is based on [place] nutrition guidelines. To learn more, visit [employer]. Exact amounts may vary. You may need different amounts. This information is not intended to replace advice given to you by your health care provider. Make sure you discuss any questions you have with your health care provider. Document Revised: 02/23/2022 Document Reviewed: 02/23/2022 Elsevier Patient Education  2024 [employer].

## 2024-04-20 ENCOUNTER — Encounter: Payer: Self-pay | Admitting: Nurse Practitioner

## 2024-04-20 ENCOUNTER — Ambulatory Visit: Admitting: Nurse Practitioner

## 2024-04-20 VITALS — BP 120/86 | HR 77 | Temp 98.0°F | Ht 66.0 in | Wt 138.6 lb

## 2024-04-20 DIAGNOSIS — Z0000 Encounter for general adult medical examination without abnormal findings: Secondary | ICD-10-CM | POA: Diagnosis not present

## 2024-04-20 DIAGNOSIS — E7800 Pure hypercholesterolemia, unspecified: Secondary | ICD-10-CM | POA: Diagnosis not present

## 2024-04-20 NOTE — Assessment & Plan Note (Signed)
Noted on past labs, she is working on diet and regular exercise.  Recheck today.

## 2024-04-20 NOTE — Progress Notes (Signed)
 BP 120/86   Pulse 77   Temp 98 F (36.7 C) (Oral)   Ht 5' 6 (1.676 m)   Wt 138 lb 9.6 oz (62.9 kg)   SpO2 97%   BMI 22.37 kg/m    Subjective:    Patient ID: Zoe Ramsey, female    DOB: 04-Mar-1996, 28 y.o.   MRN: [id number]  HPI: Zoe Ramsey is a 28 y.o. female presenting on 04/20/2024 for comprehensive medical examination. Current medical complaints include:none  She currently lives with: husband  Depression Screen done today and results listed below:     04/20/2024   11:03 AM 01/20/2023    9:40 AM 10/31/2021    1:11 PM 04/05/2020    8:56 AM 03/31/2019    3:24 PM  Depression screen PHQ 2/9  Decreased Interest 0 0 0 0 0  Down, Depressed, Hopeless 0 0 0 0 0  PHQ - 2 Score 0 0 0 0 0  Altered sleeping 0 0 0    Tired, decreased energy 0 0 0    Change in appetite 0 0 0    Feeling bad or failure about yourself  0 0 0    Trouble concentrating 1 0 0    Moving slowly or fidgety/restless 0 0 0    Suicidal thoughts 0 0 0    PHQ-9 Score 1 0  0     Difficult doing work/chores Not difficult at all Not difficult at all        Data saved with a previous flowsheet row definition      04/20/2024   11:15 AM 01/20/2023    9:41 AM 10/31/2021    1:12 PM  GAD 7 : Generalized Anxiety Score  Nervous, Anxious, on Edge 0 0 0  Control/stop worrying 0 0 0  Worry too much - different things 0 0 0  Trouble relaxing 0 0 0  Restless 0 0 0  Easily annoyed or irritable 0 0 0  Afraid - awful might happen 0 0 0  Total GAD 7 Score 0 0 0  Anxiety Difficulty Not difficult at all Not difficult at all       02/10/2018    9:24 AM 03/31/2019    3:24 PM 10/31/2021    1:11 PM 01/20/2023    9:39 AM 04/20/2024   11:15 AM  Fall Risk  Falls in the past year? No  0  0 0 0  Was there an injury with Fall?  0 0 0 0  Fall Risk Category Calculator  0 0 0 0  Fall Risk Category (Retired)  Low  Low     (RETIRED) Patient Fall Risk Level  Low fall risk  Low fall risk     Patient at Risk for Falls  Due to   No Fall Risks No Fall Risks No Fall Risks  Fall risk Follow up  Falls evaluation completed  Falls evaluation completed  Falls evaluation completed Falls evaluation completed     Data saved with a previous flowsheet row definition     Past Medical History:  History reviewed. No pertinent past medical history.  Surgical History:  Past Surgical History:  Procedure Laterality Date   WISDOM TOOTH EXTRACTION      Medications:  No current outpatient medications on file prior to visit.   Current Facility-Administered Medications on File Prior to Visit  Medication   levonorgestrel  (MIRENA ) 20 MCG/DAY IUD 1 each    Allergies:  No Known Allergies  Social History:  Social History   Socioeconomic History   Marital status: Single    Spouse name: Not on file   Number of children: Not on file   Years of education: Not on file   Highest education level: Bachelor's degree (e.g., BA, AB, BS)  Occupational History   Not on file  Tobacco Use   Smoking status: Never   Smokeless tobacco: Never  Vaping Use   Vaping status: Never Used  Substance and Sexual Activity   Alcohol use: No   Drug use: No   Sexual activity: Yes    Birth control/protection: I.U.D.  Other Topics Concern   Not on file  Social History Narrative   Not on file   Social Drivers of Health   Financial Resource Strain: Low Risk  (04/19/2024)   Overall Financial Resource Strain (CARDIA)    Difficulty of Paying Living Expenses: Not hard at all  Food Insecurity: No Food Insecurity (04/19/2024)   Hunger Vital Sign    Worried About Running Out of Food in the Last Year: Never true    Ran Out of Food in the Last Year: Never true  Transportation Needs: No Transportation Needs (04/19/2024)   PRAPARE - Administrator, Civil Service (Medical): No    Lack of Transportation (Non-Medical): No  Physical Activity: Sufficiently Active (04/19/2024)   Exercise Vital Sign    Days of Exercise per Week: 4 days     Minutes of Exercise per Session: 60 min  Stress: No Stress Concern Present (04/19/2024)   [employer] of Occupational Health - Occupational Stress Questionnaire    Feeling of Stress: Only a little  Social Connections: Moderately Integrated (04/19/2024)   Social Connection and Isolation Panel    Frequency of Communication with Friends and Family: More than three times a week    Frequency of Social Gatherings with Friends and Family: More than three times a week    Attends Religious Services: 1 to 4 times per year    Active Member of [employer] or Organizations: No    Attends Engineer, Structural: Not on file    Marital Status: Living with partner  Intimate Partner Violence: Not At Risk (04/20/2024)   Humiliation, Afraid, Rape, and Kick questionnaire    Fear of Current or Ex-Partner: No    Emotionally Abused: No    Physically Abused: No    Sexually Abused: No   Social History   Tobacco Use  Smoking Status Never  Smokeless Tobacco Never   Social History   Substance and Sexual Activity  Alcohol Use No    Family History:  Family History  Problem Relation Age of Onset   Hypertension Father    Hyperlipidemia Father    GER disease Father    Hypertension Brother    GER disease Brother    Heart disease Maternal Grandmother        CHF   Arthritis Maternal Grandmother    Heart disease Maternal Grandfather    Hypertension Maternal Grandfather    Parkinson's disease Paternal Grandmother    Dementia Paternal Grandfather    Healthy Brother    ADD / ADHD Brother     Past medical history, surgical history, medications, allergies, family history and social history reviewed with patient today and changes made to appropriate areas of the chart.    All other ROS negative except what is listed above and in the HPI.      Objective:    BP 120/86   Pulse 77  Temp 98 F (36.7 C) (Oral)   Ht 5' 6 (1.676 m)   Wt 138 lb 9.6 oz (62.9 kg)   SpO2 97%   BMI 22.37 kg/m    Wt Readings from Last 3 Encounters:  04/20/24 138 lb 9.6 oz (62.9 kg)  01/20/23 146 lb (66.2 kg)  02/24/22 149 lb 3.2 oz (67.7 kg)    Physical Exam Vitals and nursing note reviewed.  Constitutional:      General: She is awake. She is not in acute distress.    Appearance: She is well-developed and well-groomed. She is not ill-appearing or toxic-appearing.  HENT:     Head: Normocephalic and atraumatic.     Right Ear: Hearing, tympanic membrane, ear canal and external ear normal. No drainage.     Left Ear: Hearing, tympanic membrane, ear canal and external ear normal. No drainage.     Nose: Nose normal.     Right Sinus: No maxillary sinus tenderness or frontal sinus tenderness.     Left Sinus: No maxillary sinus tenderness or frontal sinus tenderness.     Mouth/Throat:     Mouth: Mucous membranes are moist.     Pharynx: Oropharynx is clear. Uvula midline. No pharyngeal swelling, oropharyngeal exudate or posterior oropharyngeal erythema.  Eyes:     General: Lids are normal.        Right eye: No discharge.        Left eye: No discharge.     Extraocular Movements: Extraocular movements intact.     Conjunctiva/sclera: Conjunctivae normal.     Pupils: Pupils are equal, round, and reactive to light.     Visual Fields: Right eye visual fields normal and left eye visual fields normal.  Neck:     Thyroid: No thyromegaly.     Vascular: No carotid bruit.     Trachea: Trachea normal.  Cardiovascular:     Rate and Rhythm: Normal rate and regular rhythm.     Heart sounds: Normal heart sounds. No murmur heard.    No gallop.  Pulmonary:     Effort: Pulmonary effort is normal. No accessory muscle usage or respiratory distress.     Breath sounds: Normal breath sounds.  Abdominal:     General: Bowel sounds are normal.     Palpations: Abdomen is soft. There is no hepatomegaly or splenomegaly.     Tenderness: There is no abdominal tenderness.  Musculoskeletal:        General: Normal range of  motion.     Cervical back: Normal range of motion and neck supple.     Right lower leg: No edema.     Left lower leg: No edema.  Lymphadenopathy:     Head:     Right side of head: No submental, submandibular, tonsillar, preauricular or posterior auricular adenopathy.     Left side of head: No submental, submandibular, tonsillar, preauricular or posterior auricular adenopathy.     Cervical: No cervical adenopathy.  Skin:    General: Skin is warm and dry.     Capillary Refill: Capillary refill takes less than 2 seconds.     Findings: No rash.  Neurological:     Mental Status: She is alert and oriented to person, place, and time.     Gait: Gait is intact.     Deep Tendon Reflexes: Reflexes are normal and symmetric.     Reflex Scores:      Brachioradialis reflexes are 2+ on the right side and 2+ on the left side.  Patellar reflexes are 2+ on the right side and 2+ on the left side. Psychiatric:        Attention and Perception: Attention normal.        Mood and Affect: Mood normal.        Speech: Speech normal.        Behavior: Behavior normal. Behavior is cooperative.        Thought Content: Thought content normal.        Judgment: Judgment normal.    Results for orders placed or performed in visit on 01/20/23  CBC with Differential/Platelet   Collection Time: 01/20/23 10:15 AM  Result Value Ref Range   WBC 5.0 3.4 - 10.8 x10E3/uL   RBC 4.50 3.77 - 5.28 x10E6/uL   Hemoglobin 13.9 11.1 - 15.9 g/dL   Hematocrit 56.9 65.9 - 46.6 %   MCV 96 79 - 97 fL   MCH 30.9 26.6 - 33.0 pg   MCHC 32.3 31.5 - 35.7 g/dL   RDW 88.0 88.2 - 84.5 %   Platelets 245 150 - 450 x10E3/uL   Neutrophils 59 Not Estab. %   Lymphs 26 Not Estab. %   Monocytes 11 Not Estab. %   Eos 3 Not Estab. %   Basos 1 Not Estab. %   Neutrophils Absolute 3.0 1.4 - 7.0 x10E3/uL   Lymphocytes Absolute 1.3 0.7 - 3.1 x10E3/uL   Monocytes Absolute 0.6 0.1 - 0.9 x10E3/uL   EOS (ABSOLUTE) 0.1 0.0 - 0.4 x10E3/uL    Basophils Absolute 0.0 0.0 - 0.2 x10E3/uL   Immature Granulocytes 0 Not Estab. %   Immature Grans (Abs) 0.0 0.0 - 0.1 x10E3/uL  Comprehensive metabolic panel   Collection Time: 01/20/23 10:15 AM  Result Value Ref Range   Glucose 89 70 - 99 mg/dL   BUN 11 6 - 20 mg/dL   Creatinine, Ser 9.01 0.57 - 1.00 mg/dL   eGFR 81 >40 fO/fpw/8.26   BUN/Creatinine Ratio 11 9 - 23   Sodium 136 134 - 144 mmol/L   Potassium 5.0 3.5 - 5.2 mmol/L   Chloride 101 96 - 106 mmol/L   CO2 25 20 - 29 mmol/L   Calcium 9.4 8.7 - 10.2 mg/dL   Total Protein 7.5 6.0 - 8.5 g/dL   Albumin 4.4 4.0 - 5.0 g/dL   Globulin, Total 3.1 1.5 - 4.5 g/dL   Bilirubin Total 0.3 0.0 - 1.2 mg/dL   Alkaline Phosphatase 61 44 - 121 IU/L   AST 23 0 - 40 IU/L   ALT 16 0 - 32 IU/L  Lipid Panel w/o Chol/HDL Ratio   Collection Time: 01/20/23 10:15 AM  Result Value Ref Range   Cholesterol, Total 163 100 - 199 mg/dL   Triglycerides 58 0 - 149 mg/dL   HDL 61 >60 mg/dL   VLDL Cholesterol Cal 12 5 - 40 mg/dL   LDL Chol Calc (NIH) 90 0 - 99 mg/dL  TSH   Collection Time: 01/20/23 10:15 AM  Result Value Ref Range   TSH 4.070 0.450 - 4.500 uIU/mL      Assessment & Plan:   Problem List Items Addressed This Visit       Other   Elevated low density lipoprotein (LDL) cholesterol level - Primary   Noted on past labs, she is working on diet and regular exercise.  Recheck today.      Relevant Orders   Comprehensive metabolic panel with GFR   Lipid Panel w/o Chol/HDL Ratio   Other Visit Diagnoses  Encounter for annual physical exam       Annual physical today with labs and health maintenance reviewed, discussed with patient.   Relevant Orders   CBC with Differential/Platelet   TSH        Follow up plan: Return in about 1 year (around 04/20/2025) for Annual Physical.   LABORATORY TESTING:  - Pap smear: Up To Date  IMMUNIZATIONS:   - Tdap: Tetanus vaccination status reviewed: last tetanus booster within 10 years. -  Influenza: Up to date - Pneumovax: Not applicable - Prevnar: Up To Date - COVID: Up to date - HPV: Up To Date - Shingrix vaccine: Not applicable  SCREENING: -Mammogram: Not applicable  - Colonoscopy: Not applicable  - Bone Density: Not applicable  -Hearing Test: Not applicable  -Spirometry: Not applicable   PATIENT COUNSELING:   Advised to take 1 mg of folate supplement per day if capable of pregnancy.   Sexuality: Discussed sexually transmitted diseases, partner selection, use of condoms, avoidance of unintended pregnancy  and contraceptive alternatives.   Advised to avoid cigarette smoking.  I discussed with the patient that most people either abstain from alcohol or drink within safe limits (<=14/week and <=4 drinks/occasion for males, <=7/weeks and <= 3 drinks/occasion for females) and that the risk for alcohol disorders and other health effects rises proportionally with the number of drinks per week and how often a drinker exceeds daily limits.  Discussed cessation/primary prevention of drug use and availability of treatment for abuse.   Diet: Encouraged to adjust caloric intake to maintain  or achieve ideal body weight, to reduce intake of dietary saturated fat and total fat, to limit sodium intake by avoiding high sodium foods and not adding table salt, and to maintain adequate dietary potassium and calcium preferably from fresh fruits, vegetables, and low-fat dairy products.    Stressed the importance of regular exercise  Injury prevention: Discussed safety belts, safety helmets, smoke detector, smoking near bedding or upholstery.   Dental health: Discussed importance of regular tooth brushing, flossing, and dental visits.    NEXT PREVENTATIVE PHYSICAL DUE IN 1 YEAR. Return in about 1 year (around 04/20/2025) for Annual Physical.

## 2024-04-21 ENCOUNTER — Ambulatory Visit: Payer: Self-pay | Admitting: Nurse Practitioner

## 2024-04-21 LAB — COMPREHENSIVE METABOLIC PANEL WITH GFR
ALT: 12 IU/L (ref 0–32)
AST: 17 IU/L (ref 0–40)
Albumin: 4.5 g/dL (ref 4.0–5.0)
Alkaline Phosphatase: 67 IU/L (ref 41–116)
BUN/Creatinine Ratio: 12 (ref 9–23)
BUN: 12 mg/dL (ref 6–20)
Bilirubin Total: 0.5 mg/dL (ref 0.0–1.2)
CO2: 22 mmol/L (ref 20–29)
Calcium: 9.5 mg/dL (ref 8.7–10.2)
Chloride: 103 mmol/L (ref 96–106)
Creatinine, Ser: 1.03 mg/dL — ABNORMAL HIGH (ref 0.57–1.00)
Globulin, Total: 2.6 g/dL (ref 1.5–4.5)
Glucose: 83 mg/dL (ref 70–99)
Potassium: 4.5 mmol/L (ref 3.5–5.2)
Sodium: 139 mmol/L (ref 134–144)
Total Protein: 7.1 g/dL (ref 6.0–8.5)
eGFR: 75 mL/min/1.73 (ref >59)

## 2024-04-21 LAB — LIPID PANEL W/O CHOL/HDL RATIO
Cholesterol, Total: 171 mg/dL (ref 100–199)
HDL: 63 mg/dL (ref >39)
LDL Chol Calc (NIH): 93 mg/dL (ref 0–99)
Triglycerides: 82 mg/dL (ref 0–149)
VLDL Cholesterol Cal: 15 mg/dL (ref 5–40)

## 2024-04-21 LAB — CBC WITH DIFFERENTIAL/PLATELET
Basophils Absolute: 0 x10E3/uL (ref 0.0–0.2)
Basos: 0 %
EOS (ABSOLUTE): 0.1 x10E3/uL (ref 0.0–0.4)
Eos: 1 %
Hematocrit: 45.2 % (ref 34.0–46.6)
Hemoglobin: 15 g/dL (ref 11.1–15.9)
Immature Grans (Abs): 0 x10E3/uL (ref 0.0–0.1)
Immature Granulocytes: 0 %
Lymphocytes Absolute: 1.1 x10E3/uL (ref 0.7–3.1)
Lymphs: 10 %
MCH: 32.8 pg (ref 26.6–33.0)
MCHC: 33.2 g/dL (ref 31.5–35.7)
MCV: 99 fL — ABNORMAL HIGH (ref 79–97)
Monocytes Absolute: 0.7 x10E3/uL (ref 0.1–0.9)
Monocytes: 7 %
Neutrophils Absolute: 8.4 x10E3/uL — ABNORMAL HIGH (ref 1.4–7.0)
Neutrophils: 82 %
Platelets: 302 x10E3/uL (ref 150–450)
RBC: 4.57 x10E6/uL (ref 3.77–5.28)
RDW: 11.9 % (ref 11.7–15.4)
WBC: 10.3 x10E3/uL (ref 3.4–10.8)

## 2024-04-21 LAB — TSH: TSH: 3.8 u[IU]/mL (ref 0.450–4.500)

## 2024-04-21 NOTE — Progress Notes (Signed)
 Contacted via MyChart  Good morning Zoe Ramsey, your labs have returned and overall look fantastic. Neutrophils are very mildly elevated, but white blood cell count normal. This could be related to recent travel or illness. We can continue to monitor at visits. Creatinine very mildly elevated, but eGFR normal. Ensure good hydration daily. Overall reassuring labs. Any questions? Keep being amazing!!  Thank you for allowing me to participate in your care.  I appreciate you. Kindest regards, Ashwin Tibbs

## 2025-04-23 ENCOUNTER — Encounter: Admitting: Nurse Practitioner
